# Patient Record
Sex: Female | Born: 1985 | Race: Black or African American | Hispanic: No | Marital: Single | Smoking: Never smoker
Health system: Southern US, Community
[De-identification: ages and names within clinical notes are randomized; demographics above are authoritative.]

## PROBLEM LIST (undated history)

## (undated) DIAGNOSIS — Z789 Other specified health status: Secondary | ICD-10-CM

## (undated) HISTORY — PX: NO PAST SURGERIES: SHX2092

## (undated) HISTORY — DX: Other specified health status: Z78.9

---

## 2016-02-16 ENCOUNTER — Encounter: Payer: Self-pay | Admitting: Certified Nurse Midwife

## 2016-03-06 NOTE — L&D Delivery Note (Signed)
Delivery Note  Patient presented for post-dates induction. Augmented with foley, cytotec, pitocin, and arom shortly prior to delivery.  At 6:31 PM a viable female was delivered via Vaginal, Spontaneous Delivery (Presentation: OA, nuchal x1 ).  APGAR: pending ; weight pending  .   Placenta status: intact.  Cord: 3v. with the following complications: none.  Cord pH: not obtained  Anesthesia:  IV opioids Episiotomy: None Lacerations: periurethral, hemostatic Suture Repair: none Est. Blood Loss (mL): 200  Mom to postpartum.  Baby to Couplet care / Skin to Skin.  Silvano Bilisoah B Kelty Szafran 08/15/2016, 6:44 PM

## 2016-03-13 ENCOUNTER — Ambulatory Visit (INDEPENDENT_AMBULATORY_CARE_PROVIDER_SITE_OTHER): Payer: Medicaid Other | Admitting: Obstetrics & Gynecology

## 2016-03-13 ENCOUNTER — Encounter: Payer: Self-pay | Admitting: Obstetrics & Gynecology

## 2016-03-13 VITALS — BP 126/83 | HR 82 | Temp 98.3°F | Ht 63.0 in | Wt 223.0 lb

## 2016-03-13 DIAGNOSIS — Z3491 Encounter for supervision of normal pregnancy, unspecified, first trimester: Secondary | ICD-10-CM

## 2016-03-13 DIAGNOSIS — Z3480 Encounter for supervision of other normal pregnancy, unspecified trimester: Secondary | ICD-10-CM

## 2016-03-13 DIAGNOSIS — Z3482 Encounter for supervision of other normal pregnancy, second trimester: Secondary | ICD-10-CM

## 2016-03-13 DIAGNOSIS — O99210 Obesity complicating pregnancy, unspecified trimester: Secondary | ICD-10-CM | POA: Insufficient documentation

## 2016-03-13 DIAGNOSIS — Z3400 Encounter for supervision of normal first pregnancy, unspecified trimester: Secondary | ICD-10-CM

## 2016-03-13 DIAGNOSIS — O99212 Obesity complicating pregnancy, second trimester: Secondary | ICD-10-CM

## 2016-03-13 DIAGNOSIS — Z3493 Encounter for supervision of normal pregnancy, unspecified, third trimester: Secondary | ICD-10-CM | POA: Insufficient documentation

## 2016-03-13 DIAGNOSIS — E669 Obesity, unspecified: Secondary | ICD-10-CM

## 2016-03-13 NOTE — Progress Notes (Signed)
..   Clinic [facility] Prenatal Labs  Dating LMP 11/01/15 Blood type: B/Positive/-- (01/08 1303)   Genetic Screen 1 Screen:    AFP:     Quad:     NIPS: Antibody:Negative (01/08 1303)  Anatomic US  Rubella: 5.79 (01/08 1303)  GTT Early:               Third trimester:  RPR: Non Reactive (01/08 1303)   Flu vaccine 03-13-16 HBsAg: Negative (01/08 1303)   TDaP vaccine                                               Rhogam: HIV: Non Reactive (01/08 1217)   Baby Food      bottlefeeding                                         GBS: (For PCN allergy, check sensitivities)  Contraception IUD mirena Pap:  Circumcision Yes, if female   Pediatrician Info given   Support Person FOB

## 2016-03-13 NOTE — Progress Notes (Signed)
  Subjective:    Erika Matthews is a G1P0 3382w6d AA P1 being seen today for her first obstetrical visit.  Her obstetrical history is significant for obesity. Patient does not intend to breast feed. Pregnancy history fully reviewed.  Patient reports no complaints.  Vitals:   03/13/16 1129 03/13/16 1131  BP: 126/83   Pulse: 82   Temp: 98.3 F (36.8 C)   Height:  5\' 3"  (1.6 m)    HISTORY: OB History  Gravida Para Term Preterm AB Living  1            SAB TAB Ectopic Multiple Live Births               # Outcome Date GA Lbr Len/2nd Weight Sex Delivery Anes PTL Lv  1 Current              History reviewed. No pertinent past medical history. History reviewed. No pertinent surgical history. History reviewed. No pertinent family history.   Exam    Uterus:     Pelvic Exam:    Perineum: No Hemorrhoids   Vulva: normal   Vagina:  normal mucosa   pH:    Cervix: anteverted   Adnexa: normal adnexa   Bony Pelvis: android  System: Breast:  normal appearance, no masses or tenderness   Skin: normal coloration and turgor, no rashes    Neurologic: oriented   Extremities: normal strength, tone, and muscle mass   HEENT PERRLA   Mouth/Teeth mucous membranes moist, pharynx normal without lesions   Neck supple   Cardiovascular: regular rate and rhythm   Respiratory:  appears well, vitals normal, no respiratory distress, acyanotic, normal RR, ear and throat exam is normal, neck free of mass or lymphadenopathy, chest clear, no wheezing, crepitations, rhonchi, normal symmetric air entry   Abdomen: soft, non-tender; bowel sounds normal; no masses,  no organomegaly   Urinary: urethral meatus normal      Assessment:    Pregnancy: G1P0 Patient Active Problem List   Diagnosis Date Noted  . Supervision of low-risk first pregnancy, unspecified trimester 03/13/2016  . Obesity in pregnancy 03/13/2016        Plan:     Initial labs drawn. Prenatal vitamins. Problem list reviewed and  updated. Genetic Screening discussed Quad Screen: ordered.  Ultrasound discussed; fetal survey: ordered.  Follow up in 4 weeks. Flu vaccine today HBA1C today She reports [facility] [place] in [facility].  Erika Matthews 03/13/2016

## 2016-03-14 LAB — HIV ANTIBODY (ROUTINE TESTING W REFLEX): HIV Screen 4th Generation wRfx: NONREACTIVE

## 2016-03-15 LAB — CULTURE, OB URINE

## 2016-03-15 LAB — URINE CULTURE, OB REFLEX

## 2016-03-16 ENCOUNTER — Encounter: Payer: Self-pay | Admitting: Obstetrics & Gynecology

## 2016-03-16 ENCOUNTER — Other Ambulatory Visit: Payer: Self-pay | Admitting: *Deleted

## 2016-03-16 DIAGNOSIS — Z3400 Encounter for supervision of normal first pregnancy, unspecified trimester: Secondary | ICD-10-CM

## 2016-03-16 DIAGNOSIS — R7989 Other specified abnormal findings of blood chemistry: Secondary | ICD-10-CM

## 2016-03-16 LAB — HEMOGLOBINOPATHY EVALUATION
HEMOGLOBIN A2 QUANTITATION: 2.2 % (ref 1.8–3.2)
HEMOGLOBIN F QUANTITATION: 0 % (ref 0.0–2.0)
HGB A: 97.8 % (ref 96.4–98.8)
HGB C: 0 %
HGB S: 0 %
HGB VARIANT: 0 %

## 2016-03-16 LAB — AFP, QUAD SCREEN
DIA MOM VALUE: 1.48
DIA VALUE (EIA): 229.38 pg/mL
DSR (BY AGE) 1 IN: 716
DSR (Second Trimester) 1 IN: 1494
Gestational Age: 18.9 WEEKS
MATERNAL AGE AT EDD: 29.8 a
MSAFP Mom: 1.04
MSAFP: 46.3 ng/mL
MSHCG MOM: 0.83
MSHCG: 17471 m[IU]/mL
Osb Risk: 10000
T18 (By Age): 1:2788 {titer}
Test Results:: NEGATIVE
UE3 MOM: 0.69
UE3 VALUE: 1.01 ng/mL
Weight: 200 [lb_av]

## 2016-03-16 LAB — PRENATAL PROFILE I(LABCORP)
ANTIBODY SCREEN: NEGATIVE
BASOS: 0 %
Basophils Absolute: 0 10*3/uL (ref 0.0–0.2)
EOS (ABSOLUTE): 0 10*3/uL (ref 0.0–0.4)
Eos: 0 %
HEMOGLOBIN: 11.7 g/dL (ref 11.1–15.9)
HEP B S AG: NEGATIVE
Hematocrit: 35.9 % (ref 34.0–46.6)
IMMATURE GRANULOCYTES: 0 %
Immature Grans (Abs): 0 10*3/uL (ref 0.0–0.1)
LYMPHS ABS: 1.6 10*3/uL (ref 0.7–3.1)
Lymphs: 20 %
MCH: 29.8 pg (ref 26.6–33.0)
MCHC: 32.6 g/dL (ref 31.5–35.7)
MCV: 91 fL (ref 79–97)
MONOS ABS: 0.5 10*3/uL (ref 0.1–0.9)
Monocytes: 7 %
NEUTROS PCT: 73 %
Neutrophils Absolute: 6 10*3/uL (ref 1.4–7.0)
Platelets: 232 10*3/uL (ref 150–379)
RBC: 3.93 x10E6/uL (ref 3.77–5.28)
RDW: 14.5 % (ref 12.3–15.4)
RH TYPE: POSITIVE
RPR: NONREACTIVE
Rubella Antibodies, IGG: 5.79 index (ref >0.99)
WBC: 8.2 10*3/uL (ref 3.4–10.8)

## 2016-03-16 LAB — VITAMIN D 25 HYDROXY (VIT D DEFICIENCY, FRACTURES): Vit D, 25-Hydroxy: 18.2 ng/mL — ABNORMAL LOW (ref 30.0–100.0)

## 2016-03-16 LAB — HEMOGLOBIN A1C
Est. average glucose Bld gHb Est-mCnc: 103 mg/dL
Hgb A1c MFr Bld: 5.2 % (ref 4.8–5.6)

## 2016-03-16 LAB — VARICELLA ZOSTER ANTIBODY, IGG: Varicella zoster IgG: 649 index (ref >165)

## 2016-03-16 MED ORDER — VITAMIN D (ERGOCALCIFEROL) 1.25 MG (50000 UNIT) PO CAPS: 50000.0000 [IU] | ORAL_CAPSULE | ORAL | 0 refills | Status: DC

## 2016-03-16 NOTE — Progress Notes (Signed)
Vitamin D ordered per Dr Marice Potterove. Pt aware.

## 2016-03-17 LAB — TOXASSURE SELECT 13 (MW), URINE

## 2016-03-20 DIAGNOSIS — Z23 Encounter for immunization: Secondary | ICD-10-CM | POA: Diagnosis not present

## 2016-03-24 ENCOUNTER — Ambulatory Visit (HOSPITAL_COMMUNITY): Payer: Medicaid Other

## 2016-03-30 ENCOUNTER — Other Ambulatory Visit: Payer: Self-pay | Admitting: Obstetrics & Gynecology

## 2016-03-30 ENCOUNTER — Ambulatory Visit (HOSPITAL_COMMUNITY)
Admission: RE | Admit: 2016-03-30 | Discharge: 2016-03-30 | Disposition: A | Discharge status: Home / Self Care | Payer: Medicaid Other | Source: Ambulatory Visit | Attending: Obstetrics & Gynecology | Admitting: Obstetrics & Gynecology

## 2016-03-30 DIAGNOSIS — O99210 Obesity complicating pregnancy, unspecified trimester: Secondary | ICD-10-CM | POA: Diagnosis present

## 2016-03-30 DIAGNOSIS — Z3A21 21 weeks gestation of pregnancy: Secondary | ICD-10-CM | POA: Insufficient documentation

## 2016-03-30 DIAGNOSIS — O99212 Obesity complicating pregnancy, second trimester: Secondary | ICD-10-CM | POA: Insufficient documentation

## 2016-03-30 DIAGNOSIS — Z3689 Encounter for other specified antenatal screening: Secondary | ICD-10-CM | POA: Insufficient documentation

## 2016-03-30 DIAGNOSIS — Z3400 Encounter for supervision of normal first pregnancy, unspecified trimester: Secondary | ICD-10-CM

## 2016-03-30 DIAGNOSIS — Z3480 Encounter for supervision of other normal pregnancy, unspecified trimester: Secondary | ICD-10-CM

## 2016-03-30 DIAGNOSIS — Z369 Encounter for antenatal screening, unspecified: Secondary | ICD-10-CM

## 2016-04-10 ENCOUNTER — Ambulatory Visit (INDEPENDENT_AMBULATORY_CARE_PROVIDER_SITE_OTHER): Payer: Medicaid Other | Admitting: Obstetrics and Gynecology

## 2016-04-10 ENCOUNTER — Ambulatory Visit (HOSPITAL_COMMUNITY): Payer: Medicaid Other

## 2016-04-10 ENCOUNTER — Encounter: Payer: Medicaid Other | Admitting: Obstetrics and Gynecology

## 2016-04-10 VITALS — BP 115/72 | HR 82 | Temp 97.3°F | Wt 233.7 lb

## 2016-04-10 DIAGNOSIS — O99212 Obesity complicating pregnancy, second trimester: Secondary | ICD-10-CM

## 2016-04-10 DIAGNOSIS — Z3400 Encounter for supervision of normal first pregnancy, unspecified trimester: Secondary | ICD-10-CM

## 2016-04-10 DIAGNOSIS — O99210 Obesity complicating pregnancy, unspecified trimester: Secondary | ICD-10-CM

## 2016-04-10 DIAGNOSIS — E669 Obesity, unspecified: Secondary | ICD-10-CM

## 2016-04-10 NOTE — Progress Notes (Signed)
   PRENATAL VISIT NOTE  Subjective:  Erika Matthews is a 31 y.o. G1P0 at 1676w6d being seen today for ongoing prenatal care.  She is currently monitored for the following issues for this low-risk pregnancy and has Supervision of low-risk first pregnancy, unspecified trimester; Obesity in pregnancy; and Low vitamin D level on her problem list.  Patient reports no complaints.  Contractions: Not present. Vag. Bleeding: None.  Movement: Present. Denies leaking of fluid.   The following portions of the patient's history were reviewed and updated as appropriate: allergies, current medications, past family history, past medical history, past social history, past surgical history and problem list. Problem list updated.  Objective:   Vitals:   04/10/16 1303  BP: 115/72  Pulse: 82  Temp: 97.3 F (36.3 C)  Weight: 233 lb 11.2 oz (106 kg)    Fetal Status: Fetal Heart Rate (bpm): 152 Fundal Height: 24 cm Movement: Present     General:  Alert, oriented and cooperative. Patient is in no acute distress.  Skin: Skin is warm and dry. No rash noted.   Cardiovascular: Normal heart rate noted  Respiratory: Normal respiratory effort, no problems with respiration noted  Abdomen: Soft, gravid, appropriate for gestational age. Pain/Pressure: Present     Pelvic:  Cervical exam deferred        Extremities: Normal range of motion.  Edema: None  Mental Status: Normal mood and affect. Normal behavior. Normal judgment and thought content.   Assessment and Plan:  Pregnancy: G1P0 at 1276w6d  1. Supervision of low-risk first pregnancy, unspecified trimester Patient is doing well Reviewed ultrasound results Follow up anatomy ordered Third trimester labs and glucola next visit [facility] - US MFM OB FOLLOW UP; Future  2. Obesity in pregnancy   Preterm labor symptoms and general obstetric precautions including but not limited to vaginal bleeding, contractions,  leaking of fluid and fetal movement were reviewed in detail with the patient. Please refer to After Visit Summary for other counseling recommendations.  Return in about 4 weeks (around 05/08/2016) for ROB and 2 hr glucola.   Catalina AntiguaPeggy Jiya Kissinger, MD

## 2016-05-01 ENCOUNTER — Other Ambulatory Visit: Payer: Medicaid Other

## 2016-05-01 ENCOUNTER — Ambulatory Visit (HOSPITAL_COMMUNITY): Payer: Medicaid Other

## 2016-05-01 ENCOUNTER — Encounter: Payer: Medicaid Other | Admitting: Obstetrics and Gynecology

## 2016-05-08 ENCOUNTER — Encounter: Payer: Medicaid Other | Admitting: Obstetrics and Gynecology

## 2016-05-08 ENCOUNTER — Ambulatory Visit (HOSPITAL_COMMUNITY): Payer: Medicaid Other

## 2016-05-10 ENCOUNTER — Other Ambulatory Visit: Payer: Medicaid Other

## 2016-05-10 ENCOUNTER — Ambulatory Visit (INDEPENDENT_AMBULATORY_CARE_PROVIDER_SITE_OTHER): Payer: Medicaid Other | Admitting: Obstetrics & Gynecology

## 2016-05-10 ENCOUNTER — Ambulatory Visit (HOSPITAL_COMMUNITY)
Admission: RE | Admit: 2016-05-10 | Discharge: 2016-05-10 | Disposition: A | Discharge status: Home / Self Care | Payer: Medicaid Other | Source: Ambulatory Visit | Attending: Obstetrics and Gynecology | Admitting: Obstetrics and Gynecology

## 2016-05-10 DIAGNOSIS — Z362 Encounter for other antenatal screening follow-up: Secondary | ICD-10-CM | POA: Diagnosis not present

## 2016-05-10 DIAGNOSIS — O99212 Obesity complicating pregnancy, second trimester: Secondary | ICD-10-CM | POA: Diagnosis not present

## 2016-05-10 DIAGNOSIS — Z3A27 27 weeks gestation of pregnancy: Secondary | ICD-10-CM | POA: Diagnosis not present

## 2016-05-10 DIAGNOSIS — Z3400 Encounter for supervision of normal first pregnancy, unspecified trimester: Secondary | ICD-10-CM

## 2016-05-10 DIAGNOSIS — Z3402 Encounter for supervision of normal first pregnancy, second trimester: Secondary | ICD-10-CM

## 2016-05-10 MED ORDER — VITAMIN D (ERGOCALCIFEROL) 1.25 MG (50000 UNIT) PO CAPS: 50000.0000 [IU] | ORAL_CAPSULE | ORAL | 0 refills | Status: AC

## 2016-05-10 NOTE — Progress Notes (Signed)
Patient needs new RX for VIT.-D and Prenatals

## 2016-05-10 NOTE — Progress Notes (Signed)
   PRENATAL VISIT NOTE  Subjective:  Erika Matthews is a 31 y.o. G1P0 at 7153w1d being seen today for ongoing prenatal care.  She is currently monitored for the following issues for this low-risk pregnancy and has Supervision of low-risk first pregnancy, unspecified trimester; Obesity in pregnancy; and Low vitamin D level on her problem list.  Patient reports pelvic pressure.  Contractions: Not present. Vag. Bleeding: None.  Movement: Present. Denies leaking of fluid.   The following portions of the patient's history were reviewed and updated as appropriate: allergies, current medications, past family history, past medical history, past social history, past surgical history and problem list. Problem list updated.  Objective:   Vitals:   05/10/16 0918  BP: 117/75  Pulse: 86  Weight: 234 lb (106.1 kg)    Fetal Status: Fetal Heart Rate (bpm): 154   Movement: Present     General:  Alert, oriented and cooperative. Patient is in no acute distress.  Skin: Skin is warm and dry. No rash noted.   Cardiovascular: Normal heart rate noted  Respiratory: Normal respiratory effort, no problems with respiration noted  Abdomen: Soft, gravid, appropriate for gestational age. Pain/Pressure: Present     Pelvic:  Cervical exam deferred        Extremities: Normal range of motion.  Edema: None  Mental Status: Normal mood and affect. Normal behavior. Normal judgment and thought content.   Assessment and Plan:  Pregnancy: G1P0 at 1453w1d  1. Supervision of low-risk first pregnancy, unspecified trimester  - Glucose Tolerance, 2 Hours w/1 Hour - CBC - HIV antibody (with reflex) - RPR - Vitamin D, Ergocalciferol, (DRISDOL) 50000 units CAPS capsule; Take 1 capsule (50,000 Units total) by mouth once a week. For 10 weeks  Dispense: 10 capsule; Refill: 0  Preterm labor symptoms and general obstetric precautions including but not limited to vaginal bleeding, contractions, leaking of fluid and fetal movement were  reviewed in detail with the patient. Please refer to After Visit Summary for other counseling recommendations.  Return in about 4 weeks (around 06/07/2016).   Adam PhenixJames G Byrd Rushlow, MD

## 2016-05-10 NOTE — Patient Instructions (Signed)
Third Trimester of Pregnancy The third trimester is from week 28 through week 40 (months 7 through 9). The third trimester is a time when the unborn baby (fetus) is growing rapidly. At the end of the ninth month, the fetus is about 20 inches in length and weighs 6-10 pounds. Body changes during your third trimester Your body will continue to go through many changes during pregnancy. The changes vary from woman to woman. During the third trimester:  Your weight will continue to increase. You can expect to gain 25-35 pounds (11-16 kg) by the end of the pregnancy.  You may begin to get stretch marks on your hips, abdomen, and breasts.  You may urinate more often because the fetus is moving lower into your pelvis and pressing on your bladder.  You may develop or continue to have heartburn. This is caused by increased hormones that slow down muscles in the digestive tract.  You may develop or continue to have constipation because increased hormones slow digestion and cause the muscles that push waste through your intestines to relax.  You may develop hemorrhoids. These are swollen veins (varicose veins) in the rectum that can itch or be painful.  You may develop swollen, bulging veins (varicose veins) in your legs.  You may have increased body aches in the pelvis, back, or thighs. This is due to weight gain and increased hormones that are relaxing your joints.  You may have changes in your hair. These can include thickening of your hair, rapid growth, and changes in texture. Some women also have hair loss during or after pregnancy, or hair that feels dry or thin. Your hair will most likely return to normal after your baby is born.  Your breasts will continue to grow and they will continue to become tender. A yellow fluid (colostrum) may leak from your breasts. This is the first milk you are producing for your baby.  Your belly button may stick out.  You may notice more swelling in your hands,  face, or ankles.  You may have increased tingling or numbness in your hands, arms, and legs. The skin on your belly may also feel numb.  You may feel short of breath because of your expanding uterus.  You may have more problems sleeping. This can be caused by the size of your belly, increased need to urinate, and an increase in your body's metabolism.  You may notice the fetus "dropping," or moving lower in your abdomen (lightening).  You may have increased vaginal discharge.  You may notice your joints feel loose and you may have pain around your pelvic bone.  What to expect at prenatal visits You will have prenatal exams every 2 weeks until week 36. Then you will have weekly prenatal exams. During a routine prenatal visit:  You will be weighed to make sure you and the baby are growing normally.  Your blood pressure will be taken.  Your abdomen will be measured to track your baby's growth.  The fetal heartbeat will be listened to.  Any test results from the previous visit will be discussed.  You may have a cervical check near your due date to see if your cervix has softened or thinned (effaced).  You will be tested for Group B streptococcus. This happens between 35 and 37 weeks.  Your health care provider may ask you:  What your birth plan is.  How you are feeling.  If you are feeling the baby move.  If you have had   any abnormal symptoms, such as leaking fluid, bleeding, severe headaches, or abdominal cramping.  If you are using any tobacco products, including cigarettes, chewing tobacco, and electronic cigarettes.  If you have any questions.  Other tests or screenings that may be performed during your third trimester include:  Blood tests that check for low iron levels (anemia).  Fetal testing to check the health, activity level, and growth of the fetus. Testing is done if you have certain medical conditions or if there are problems during the  pregnancy.  Nonstress test (NST). This test checks the health of your baby to make sure there are no signs of problems, such as the baby not getting enough oxygen. During this test, a belt is placed around your belly. The baby is made to move, and its heart rate is monitored during movement.  What is false labor? False labor is a condition in which you feel small, irregular tightenings of the muscles in the womb (contractions) that usually go away with rest, changing position, or drinking water. These are called Braxton Hicks contractions. Contractions may last for hours, days, or even weeks before true labor sets in. If contractions come at regular intervals, become more frequent, increase in intensity, or become painful, you should see your health care provider. What are the signs of labor?  Abdominal cramps.  Regular contractions that start at 10 minutes apart and become stronger and more frequent with time.  Contractions that start on the top of the uterus and spread down to the lower abdomen and back.  Increased pelvic pressure and dull back pain.  A watery or bloody mucus discharge that comes from the vagina.  Leaking of amniotic fluid. This is also known as your "water breaking." It could be a slow trickle or a gush. Let your health care provider know if it has a color or strange odor. If you have any of these signs, call your health care provider right away, even if it is before your due date. Follow these instructions at home: Medicines  Follow your health care provider's instructions regarding medicine use. Specific medicines may be either safe or unsafe to take during pregnancy.  Take a prenatal vitamin that contains at least 600 micrograms (mcg) of folic acid.  If you develop constipation, try taking a stool softener if your health care provider approves. Eating and drinking  Eat a balanced diet that includes fresh fruits and vegetables, whole grains, good sources of protein  such as meat, eggs, or tofu, and low-fat dairy. Your health care provider will help you determine the amount of weight gain that is right for you.  Avoid raw meat and uncooked cheese. These carry germs that can cause birth defects in the baby.  If you have low calcium intake from food, talk to your health care provider about whether you should take a daily calcium supplement.  Eat four or five small meals rather than three large meals a day.  Limit foods that are high in fat and processed sugars, such as fried and sweet foods.  To prevent constipation: ? Drink enough fluid to keep your urine clear or pale yellow. ? Eat foods that are high in fiber, such as fresh fruits and vegetables, whole grains, and beans. Activity  Exercise only as directed by your health care provider. Most women can continue their usual exercise routine during pregnancy. Try to exercise for 30 minutes at least 5 days a week. Stop exercising if you experience uterine contractions.  Avoid heavy   lifting.  Do not exercise in extreme heat or humidity, or at high altitudes.  Wear low-heel, comfortable shoes.  Practice good posture.  You may continue to have sex unless your health care provider tells you otherwise. Relieving pain and discomfort  Take frequent breaks and rest with your legs elevated if you have leg cramps or low back pain.  Take warm sitz baths to soothe any pain or discomfort caused by hemorrhoids. Use hemorrhoid cream if your health care provider approves.  Wear a good support bra to prevent discomfort from breast tenderness.  If you develop varicose veins: ? Wear support pantyhose or compression stockings as told by your healthcare provider. ? Elevate your feet for 15 minutes, 3-4 times a day. Prenatal care  Write down your questions. Take them to your prenatal visits.  Keep all your prenatal visits as told by your health care provider. This is important. Safety  Wear your seat belt at  all times when driving.  Make a list of emergency phone numbers, including numbers for family, friends, the hospital, and police and fire departments. General instructions  Avoid cat litter boxes and soil used by cats. These carry germs that can cause birth defects in the baby. If you have a cat, ask someone to clean the litter box for you.  Do not travel far distances unless it is absolutely necessary and only with the approval of your health care provider.  Do not use hot tubs, steam rooms, or saunas.  Do not drink alcohol.  Do not use any products that contain nicotine or tobacco, such as cigarettes and e-cigarettes. If you need help quitting, ask your health care provider.  Do not use any medicinal herbs or unprescribed drugs. These chemicals affect the formation and growth of the baby.  Do not douche or use tampons or scented sanitary pads.  Do not cross your legs for long periods of time.  To prepare for the arrival of your baby: ? Take prenatal classes to understand, practice, and ask questions about labor and delivery. ? Make a trial run to the hospital. ? Visit the hospital and tour the maternity area. ? Arrange for maternity or paternity leave through employers. ? Arrange for family and friends to take care of pets while you are in the hospital. ? Purchase a rear-facing car seat and make sure you know how to install it in your car. ? Pack your hospital bag. ? Prepare the baby's nursery. Make sure to remove all pillows and stuffed animals from the baby's crib to prevent suffocation.  Visit your dentist if you have not gone during your pregnancy. Use a soft toothbrush to brush your teeth and be gentle when you floss. Contact a health care provider if:  You are unsure if you are in labor or if your water has broken.  You become dizzy.  You have mild pelvic cramps, pelvic pressure, or nagging pain in your abdominal area.  You have lower back pain.  You have persistent  nausea, vomiting, or diarrhea.  You have an unusual or bad smelling vaginal discharge.  You have pain when you urinate. Get help right away if:  Your water breaks before 37 weeks.  You have regular contractions less than 5 minutes apart before 37 weeks.  You have a fever.  You are leaking fluid from your vagina.  You have spotting or bleeding from your vagina.  You have severe abdominal pain or cramping.  You have rapid weight loss or weight gain.    You have shortness of breath with chest pain.  You notice sudden or extreme swelling of your face, hands, ankles, feet, or legs.  Your baby makes fewer than 10 movements in 2 hours.  You have severe headaches that do not go away when you take medicine.  You have vision changes. Summary  The third trimester is from week 28 through week 40, months 7 through 9. The third trimester is a time when the unborn baby (fetus) is growing rapidly.  During the third trimester, your discomfort may increase as you and your baby continue to gain weight. You may have abdominal, leg, and back pain, sleeping problems, and an increased need to urinate.  During the third trimester your breasts will keep growing and they will continue to become tender. A yellow fluid (colostrum) may leak from your breasts. This is the first milk you are producing for your baby.  False labor is a condition in which you feel small, irregular tightenings of the muscles in the womb (contractions) that eventually go away. These are called Braxton Hicks contractions. Contractions may last for hours, days, or even weeks before true labor sets in.  Signs of labor can include: abdominal cramps; regular contractions that start at 10 minutes apart and become stronger and more frequent with time; watery or bloody mucus discharge that comes from the vagina; increased pelvic pressure and dull back pain; and leaking of amniotic fluid. This information is not intended to replace advice  given to you by your health care provider. Make sure you discuss any questions you have with your health care provider. Document Released: 02/14/2001 Document Revised: 07/29/2015 Document Reviewed: 04/23/2012 Elsevier Interactive Patient Education  2017 [employer]  

## 2016-05-11 LAB — CBC
HEMATOCRIT: 33.4 % — AB (ref 34.0–46.6)
HEMOGLOBIN: 11.3 g/dL (ref 11.1–15.9)
MCH: 29.8 pg (ref 26.6–33.0)
MCHC: 33.8 g/dL (ref 31.5–35.7)
MCV: 88 fL (ref 79–97)
Platelets: 197 10*3/uL (ref 150–379)
RBC: 3.79 x10E6/uL (ref 3.77–5.28)
RDW: 14.1 % (ref 12.3–15.4)
WBC: 6.9 10*3/uL (ref 3.4–10.8)

## 2016-05-11 LAB — GLUCOSE TOLERANCE, 2 HOURS W/ 1HR
GLUCOSE, FASTING: 85 mg/dL (ref 65–91)
Glucose, 1 hour: 92 mg/dL (ref 65–179)
Glucose, 2 hour: 99 mg/dL (ref 65–152)

## 2016-05-11 LAB — RPR: RPR Ser Ql: NONREACTIVE

## 2016-05-11 LAB — HIV ANTIBODY (ROUTINE TESTING W REFLEX): HIV Screen 4th Generation wRfx: NONREACTIVE

## 2016-06-07 ENCOUNTER — Encounter: Payer: Self-pay | Admitting: Obstetrics & Gynecology

## 2016-06-07 ENCOUNTER — Ambulatory Visit (INDEPENDENT_AMBULATORY_CARE_PROVIDER_SITE_OTHER): Payer: Medicaid Other | Admitting: Obstetrics & Gynecology

## 2016-06-07 DIAGNOSIS — Z3483 Encounter for supervision of other normal pregnancy, third trimester: Secondary | ICD-10-CM

## 2016-06-07 DIAGNOSIS — Z3493 Encounter for supervision of normal pregnancy, unspecified, third trimester: Secondary | ICD-10-CM

## 2016-06-07 NOTE — Progress Notes (Signed)
[place] 3/7 reviewed, 77%ile   PRENATAL VISIT NOTE  Subjective:tired at night  Erika Matthews is a 31 y.o. G2P1001 at [redacted]w[redacted]d being seen today for ongoing prenatal care.  She is currently monitored for the following issues for this low-risk pregnancy and has Supervision of low-risk pregnancy, third trimester; Obesity in pregnancy; and Low vitamin D level on her problem list.  Patient reports fatigue.  Contractions: Not present. Vag. Bleeding: None.  Movement: Present. Denies leaking of fluid.   The following portions of the patient's history were reviewed and updated as appropriate: allergies, current medications, past family history, past medical history, past social history, past surgical history and problem list. Problem list updated.  Objective:   Vitals:   06/07/16 0843  BP: 108/71  Pulse: 95  Weight: 106 kg (233 lb 9.6 oz)    Fetal Status: Fetal Heart Rate (bpm): 130   Movement: Present     General:  Alert, oriented and cooperative. Patient is in no acute distress.  Skin: Skin is warm and dry. No rash noted.   Cardiovascular: Normal heart rate noted  Respiratory: Normal respiratory effort, no problems with respiration noted  Abdomen: Soft, gravid, appropriate for gestational age. Pain/Pressure: Present     Pelvic:  Cervical exam deferred        Extremities: Normal range of motion.  Edema: None  Mental Status: Normal mood and affect. Normal behavior. Normal judgment and thought content.   Assessment and Plan:  Pregnancy: G2P1001 at [redacted]w[redacted]d  1. Supervision of low-risk pregnancy, third trimester Try to stay active and get enough sleep  Preterm labor symptoms and general obstetric precautions including but not limited to vaginal bleeding, contractions, leaking of fluid and fetal movement were reviewed in detail with the patient. Please refer to After Visit Summary for other counseling recommendations.  Return in about 2 weeks (around 06/21/2016).   Adam Phenix, MD

## 2016-06-07 NOTE — Progress Notes (Signed)
Pt presents for ROB without complaints.  

## 2016-06-07 NOTE — Patient Instructions (Signed)
Third Trimester of Pregnancy The third trimester is from week 28 through week 40 (months 7 through 9). The third trimester is a time when the unborn baby (fetus) is growing rapidly. At the end of the ninth month, the fetus is about 20 inches in length and weighs 6-10 pounds. Body changes during your third trimester Your body will continue to go through many changes during pregnancy. The changes vary from woman to woman. During the third trimester:  Your weight will continue to increase. You can expect to gain 25-35 pounds (11-16 kg) by the end of the pregnancy.  You may begin to get stretch marks on your hips, abdomen, and breasts.  You may urinate more often because the fetus is moving lower into your pelvis and pressing on your bladder.  You may develop or continue to have heartburn. This is caused by increased hormones that slow down muscles in the digestive tract.  You may develop or continue to have constipation because increased hormones slow digestion and cause the muscles that push waste through your intestines to relax.  You may develop hemorrhoids. These are swollen veins (varicose veins) in the rectum that can itch or be painful.  You may develop swollen, bulging veins (varicose veins) in your legs.  You may have increased body aches in the pelvis, back, or thighs. This is due to weight gain and increased hormones that are relaxing your joints.  You may have changes in your hair. These can include thickening of your hair, rapid growth, and changes in texture. Some women also have hair loss during or after pregnancy, or hair that feels dry or thin. Your hair will most likely return to normal after your baby is born.  Your breasts will continue to grow and they will continue to become tender. A yellow fluid (colostrum) may leak from your breasts. This is the first milk you are producing for your baby.  Your belly button may stick out.  You may notice more swelling in your hands,  face, or ankles.  You may have increased tingling or numbness in your hands, arms, and legs. The skin on your belly may also feel numb.  You may feel short of breath because of your expanding uterus.  You may have more problems sleeping. This can be caused by the size of your belly, increased need to urinate, and an increase in your body's metabolism.  You may notice the fetus "dropping," or moving lower in your abdomen (lightening).  You may have increased vaginal discharge.  You may notice your joints feel loose and you may have pain around your pelvic bone.  What to expect at prenatal visits You will have prenatal exams every 2 weeks until week 36. Then you will have weekly prenatal exams. During a routine prenatal visit:  You will be weighed to make sure you and the baby are growing normally.  Your blood pressure will be taken.  Your abdomen will be measured to track your baby's growth.  The fetal heartbeat will be listened to.  Any test results from the previous visit will be discussed.  You may have a cervical check near your due date to see if your cervix has softened or thinned (effaced).  You will be tested for Group B streptococcus. This happens between 35 and 37 weeks.  Your health care provider may ask you:  What your birth plan is.  How you are feeling.  If you are feeling the baby move.  If you have had   any abnormal symptoms, such as leaking fluid, bleeding, severe headaches, or abdominal cramping.  If you are using any tobacco products, including cigarettes, chewing tobacco, and electronic cigarettes.  If you have any questions.  Other tests or screenings that may be performed during your third trimester include:  Blood tests that check for low iron levels (anemia).  Fetal testing to check the health, activity level, and growth of the fetus. Testing is done if you have certain medical conditions or if there are problems during the  pregnancy.  Nonstress test (NST). This test checks the health of your baby to make sure there are no signs of problems, such as the baby not getting enough oxygen. During this test, a belt is placed around your belly. The baby is made to move, and its heart rate is monitored during movement.  What is false labor? False labor is a condition in which you feel small, irregular tightenings of the muscles in the womb (contractions) that usually go away with rest, changing position, or drinking water. These are called Braxton Hicks contractions. Contractions may last for hours, days, or even weeks before true labor sets in. If contractions come at regular intervals, become more frequent, increase in intensity, or become painful, you should see your health care provider. What are the signs of labor?  Abdominal cramps.  Regular contractions that start at 10 minutes apart and become stronger and more frequent with time.  Contractions that start on the top of the uterus and spread down to the lower abdomen and back.  Increased pelvic pressure and dull back pain.  A watery or bloody mucus discharge that comes from the vagina.  Leaking of amniotic fluid. This is also known as your "water breaking." It could be a slow trickle or a gush. Let your health care provider know if it has a color or strange odor. If you have any of these signs, call your health care provider right away, even if it is before your due date. Follow these instructions at home: Medicines  Follow your health care provider's instructions regarding medicine use. Specific medicines may be either safe or unsafe to take during pregnancy.  Take a prenatal vitamin that contains at least 600 micrograms (mcg) of folic acid.  If you develop constipation, try taking a stool softener if your health care provider approves. Eating and drinking  Eat a balanced diet that includes fresh fruits and vegetables, whole grains, good sources of protein  such as meat, eggs, or tofu, and low-fat dairy. Your health care provider will help you determine the amount of weight gain that is right for you.  Avoid raw meat and uncooked cheese. These carry germs that can cause birth defects in the baby.  If you have low calcium intake from food, talk to your health care provider about whether you should take a daily calcium supplement.  Eat four or five small meals rather than three large meals a day.  Limit foods that are high in fat and processed sugars, such as fried and sweet foods.  To prevent constipation: ? Drink enough fluid to keep your urine clear or pale yellow. ? Eat foods that are high in fiber, such as fresh fruits and vegetables, whole grains, and beans. Activity  Exercise only as directed by your health care provider. Most women can continue their usual exercise routine during pregnancy. Try to exercise for 30 minutes at least 5 days a week. Stop exercising if you experience uterine contractions.  Avoid heavy   lifting.  Do not exercise in extreme heat or humidity, or at high altitudes.  Wear low-heel, comfortable shoes.  Practice good posture.  You may continue to have sex unless your health care provider tells you otherwise. Relieving pain and discomfort  Take frequent breaks and rest with your legs elevated if you have leg cramps or low back pain.  Take warm sitz baths to soothe any pain or discomfort caused by hemorrhoids. Use hemorrhoid cream if your health care provider approves.  Wear a good support bra to prevent discomfort from breast tenderness.  If you develop varicose veins: ? Wear support pantyhose or compression stockings as told by your healthcare provider. ? Elevate your feet for 15 minutes, 3-4 times a day. Prenatal care  Write down your questions. Take them to your prenatal visits.  Keep all your prenatal visits as told by your health care provider. This is important. Safety  Wear your seat belt at  all times when driving.  Make a list of emergency phone numbers, including numbers for family, friends, the hospital, and police and fire departments. General instructions  Avoid cat litter boxes and soil used by cats. These carry germs that can cause birth defects in the baby. If you have a cat, ask someone to clean the litter box for you.  Do not travel far distances unless it is absolutely necessary and only with the approval of your health care provider.  Do not use hot tubs, steam rooms, or saunas.  Do not drink alcohol.  Do not use any products that contain nicotine or tobacco, such as cigarettes and e-cigarettes. If you need help quitting, ask your health care provider.  Do not use any medicinal herbs or unprescribed drugs. These chemicals affect the formation and growth of the baby.  Do not douche or use tampons or scented sanitary pads.  Do not cross your legs for long periods of time.  To prepare for the arrival of your baby: ? Take prenatal classes to understand, practice, and ask questions about labor and delivery. ? Make a trial run to the hospital. ? Visit the hospital and tour the maternity area. ? Arrange for maternity or paternity leave through employers. ? Arrange for family and friends to take care of pets while you are in the hospital. ? Purchase a rear-facing car seat and make sure you know how to install it in your car. ? Pack your hospital bag. ? Prepare the baby's nursery. Make sure to remove all pillows and stuffed animals from the baby's crib to prevent suffocation.  Visit your dentist if you have not gone during your pregnancy. Use a soft toothbrush to brush your teeth and be gentle when you floss. Contact a health care provider if:  You are unsure if you are in labor or if your water has broken.  You become dizzy.  You have mild pelvic cramps, pelvic pressure, or nagging pain in your abdominal area.  You have lower back pain.  You have persistent  nausea, vomiting, or diarrhea.  You have an unusual or bad smelling vaginal discharge.  You have pain when you urinate. Get help right away if:  Your water breaks before 37 weeks.  You have regular contractions less than 5 minutes apart before 37 weeks.  You have a fever.  You are leaking fluid from your vagina.  You have spotting or bleeding from your vagina.  You have severe abdominal pain or cramping.  You have rapid weight loss or weight gain.    You have shortness of breath with chest pain.  You notice sudden or extreme swelling of your face, hands, ankles, feet, or legs.  Your baby makes fewer than 10 movements in 2 hours.  You have severe headaches that do not go away when you take medicine.  You have vision changes. Summary  The third trimester is from week 28 through week 40, months 7 through 9. The third trimester is a time when the unborn baby (fetus) is growing rapidly.  During the third trimester, your discomfort may increase as you and your baby continue to gain weight. You may have abdominal, leg, and back pain, sleeping problems, and an increased need to urinate.  During the third trimester your breasts will keep growing and they will continue to become tender. A yellow fluid (colostrum) may leak from your breasts. This is the first milk you are producing for your baby.  False labor is a condition in which you feel small, irregular tightenings of the muscles in the womb (contractions) that eventually go away. These are called Braxton Hicks contractions. Contractions may last for hours, days, or even weeks before true labor sets in.  Signs of labor can include: abdominal cramps; regular contractions that start at 10 minutes apart and become stronger and more frequent with time; watery or bloody mucus discharge that comes from the vagina; increased pelvic pressure and dull back pain; and leaking of amniotic fluid. This information is not intended to replace advice  given to you by your health care provider. Make sure you discuss any questions you have with your health care provider. Document Released: 02/14/2001 Document Revised: 07/29/2015 Document Reviewed: 04/23/2012 Elsevier Interactive Patient Education  2017 [employer]  

## 2016-06-21 ENCOUNTER — Encounter: Payer: Self-pay | Admitting: Certified Nurse Midwife

## 2016-06-21 ENCOUNTER — Ambulatory Visit (INDEPENDENT_AMBULATORY_CARE_PROVIDER_SITE_OTHER): Payer: Medicaid Other | Admitting: Certified Nurse Midwife

## 2016-06-21 VITALS — BP 112/77 | HR 97 | Wt 234.4 lb

## 2016-06-21 DIAGNOSIS — E559 Vitamin D deficiency, unspecified: Secondary | ICD-10-CM

## 2016-06-21 DIAGNOSIS — O26843 Uterine size-date discrepancy, third trimester: Secondary | ICD-10-CM

## 2016-06-21 DIAGNOSIS — O99213 Obesity complicating pregnancy, third trimester: Secondary | ICD-10-CM

## 2016-06-21 DIAGNOSIS — Z3493 Encounter for supervision of normal pregnancy, unspecified, third trimester: Secondary | ICD-10-CM

## 2016-06-21 DIAGNOSIS — O99210 Obesity complicating pregnancy, unspecified trimester: Secondary | ICD-10-CM

## 2016-06-21 DIAGNOSIS — R7989 Other specified abnormal findings of blood chemistry: Secondary | ICD-10-CM

## 2016-06-21 NOTE — Progress Notes (Signed)
Patient is in the office, reports good fetal movement, denies pain. 

## 2016-06-21 NOTE — Patient Instructions (Signed)
AREA PEDIATRIC/FAMILY PRACTICE PHYSICIANS  [facility] [address], [address] [place], [place]  [address] Phone - [phone]   Fax - [phone]  [facility] [address] [place], [place] [address] Phone - [phone]   Fax - [phone]  [facility] AM[facility] [address] [place], [place]  [address] Phone - [phone]   Fax - [phone]  [facility] [address] [place], [place]  [address] Phone - [phone]   Fax - [phone]  [facility] [address] [place], [place]  [address] Phone - [phone]   Fax - [phone]  [facility] [address], [address] [place], [place]  [address] Phone - [phone]   Fax - [phone]  [facility] [address] [place], [place]  [address] Phone - [phone]   Fax - [phone]  [facility] [address], [address] [place], [place]  [address] Phone - [phone]   Fax - [phone]  [facility] [address] [place], [place]  [address] Phone - [phone]   Fax - [phone] [facility] [address] [place], [place]  [address] Phone - [phone]   Fax - [phone]  [facility] [address] [place], [place]  [address] Phone - [phone]   Fax - [phone]  [facility] [address] [place], [place]  [address] Phone - [phone]   Fax - [phone]  [facility] [address], [address] [place], [place]  [address] Phone - [phone]   Fax - [phone]  SHILPA GOSRANI [address] [place], [place]  [address] Phone - [phone]  [facility] [address] [place], [place]  [address] Phone - [phone]   Fax - [phone]  [facility] [address] [place], [place]  [address] Phone - [phone]   Fax - [phone]  [facility] [address] [place], [place]  [address] Phone - [phone]   Fax - [phone]  [facility] [address] [place], [place]  [address] Phone - [phone]   Fax - [phone]   [facility] [address] [place], [place]  [address] Phone - [phone]   Fax - [phone]  [facility] [address] [place], [place]  [address] Phone - [phone]   Fax - [phone]  DAVID RUBIN [address] [place], [place]  [address] Phone - [phone]   Fax - [phone]  [facility] [address] [place], [place]  [address] Phone - [phone]   Fax - [phone]  [facility] [address] [place], [place]  [address] Phone - [phone]   Fax - [phone] [facility] [address] [place], [place]  [address] Phone - [phone]   Fax - [phone]  [facility] [address] [place], [place]  [address] Phone - [phone]   Fax - [phone]  [facility] [address] [place], [place]  [address] Phone - [phone]   Fax - [phone]  [facility] Charlene Flemming MD [address] [place]  [address] Phone [phone]  Fax [phone]   

## 2016-06-21 NOTE — Progress Notes (Signed)
   PRENATAL VISIT NOTE  Subjective:  Erika Matthews is a 31 y.o. G2P1001 at [redacted]w[redacted]d being seen today for ongoing prenatal care.  She is currently monitored for the following issues for this low-risk pregnancy and has Supervision of low-risk pregnancy, third trimester; Obesity in pregnancy; and Low vitamin D level on her problem list.  Patient reports backache, no bleeding, no contractions, no cramping and no leaking.  Contractions: Not present. Vag. Bleeding: None.  Movement: Present. Denies leaking of fluid.   The following portions of the patient's history were reviewed and updated as appropriate: allergies, current medications, past family history, past medical history, past social history, past surgical history and problem list. Problem list updated.  Objective:   Vitals:   06/21/16 0910  BP: 112/77  Pulse: 97  Weight: 234 lb 6.4 oz (106.3 kg)    Fetal Status: Fetal Heart Rate (bpm): 150 Fundal Height: 39 cm Movement: Present     General:  Alert, oriented and cooperative. Patient is in no acute distress.  Skin: Skin is warm and dry. No rash noted.   Cardiovascular: Normal heart rate noted  Respiratory: Normal respiratory effort, no problems with respiration noted  Abdomen: Soft, gravid, appropriate for gestational age. Pain/Pressure: Absent     Pelvic:  Cervical exam deferred        Extremities: Normal range of motion.  Edema: None  Mental Status: Normal mood and affect. Normal behavior. Normal judgment and thought content.   Assessment and Plan:  Pregnancy: G2P1001 at [redacted]w[redacted]d  1. Supervision of low-risk pregnancy, third trimester      Doing well  2. Obesity in pregnancy      34 lb weight gain this pregnancy, BMI 35%  3. Low vitamin D level     Taking weekly vitamin D  4. Uterine size date discrepancy pregnancy, third trimester     Size > Dates, EFW in March 77%, normal 2 hour OGTT - [place] MFM OB FOLLOW UP; Future  Preterm labor symptoms and general obstetric precautions  including but not limited to vaginal bleeding, contractions, leaking of fluid and fetal movement were reviewed in detail with the patient. Please refer to After Visit Summary for other counseling recommendations.  Return in about 2 weeks (around 07/05/2016) for ROB, GBS.   Roe Coombs, CNM

## 2016-06-29 ENCOUNTER — Ambulatory Visit (HOSPITAL_COMMUNITY): Payer: Medicaid Other

## 2016-07-06 ENCOUNTER — Other Ambulatory Visit: Payer: Self-pay | Admitting: Certified Nurse Midwife

## 2016-07-06 ENCOUNTER — Encounter: Payer: Self-pay | Admitting: Certified Nurse Midwife

## 2016-07-06 ENCOUNTER — Ambulatory Visit (INDEPENDENT_AMBULATORY_CARE_PROVIDER_SITE_OTHER): Payer: Medicaid Other | Admitting: Certified Nurse Midwife

## 2016-07-06 ENCOUNTER — Ambulatory Visit (HOSPITAL_COMMUNITY)
Admission: RE | Admit: 2016-07-06 | Discharge: 2016-07-06 | Disposition: A | Discharge status: Home / Self Care | Payer: Medicaid Other | Source: Ambulatory Visit | Attending: Certified Nurse Midwife | Admitting: Certified Nurse Midwife

## 2016-07-06 ENCOUNTER — Other Ambulatory Visit (HOSPITAL_COMMUNITY)
Admission: RE | Admit: 2016-07-06 | Discharge: 2016-07-06 | Disposition: A | Discharge status: Home / Self Care | Payer: Medicaid Other | Source: Ambulatory Visit | Attending: Certified Nurse Midwife | Admitting: Certified Nurse Midwife

## 2016-07-06 VITALS — BP 114/72 | HR 109 | Wt 233.0 lb

## 2016-07-06 DIAGNOSIS — E669 Obesity, unspecified: Secondary | ICD-10-CM

## 2016-07-06 DIAGNOSIS — Z3493 Encounter for supervision of normal pregnancy, unspecified, third trimester: Secondary | ICD-10-CM | POA: Insufficient documentation

## 2016-07-06 DIAGNOSIS — Z3A35 35 weeks gestation of pregnancy: Secondary | ICD-10-CM | POA: Diagnosis not present

## 2016-07-06 DIAGNOSIS — O99213 Obesity complicating pregnancy, third trimester: Secondary | ICD-10-CM | POA: Insufficient documentation

## 2016-07-06 DIAGNOSIS — R7989 Other specified abnormal findings of blood chemistry: Secondary | ICD-10-CM

## 2016-07-06 DIAGNOSIS — O26843 Uterine size-date discrepancy, third trimester: Secondary | ICD-10-CM | POA: Insufficient documentation

## 2016-07-06 DIAGNOSIS — O3663X0 Maternal care for excessive fetal growth, third trimester, not applicable or unspecified: Secondary | ICD-10-CM

## 2016-07-06 DIAGNOSIS — O99210 Obesity complicating pregnancy, unspecified trimester: Secondary | ICD-10-CM

## 2016-07-06 NOTE — Progress Notes (Signed)
   PRENATAL VISIT NOTE  Subjective:  Erika Matthews is a 31 y.o. G2P1001 at 2370w2d being seen today for ongoing prenatal care.  She is currently monitored for the following issues for this low-risk pregnancy and has Supervision of low-risk pregnancy, third trimester; Obesity in pregnancy; Low vitamin D level; and Excessive fetal growth affecting management of mother in third trimester, antepartum on her problem list.  Patient reports no complaints.  Contractions: Irregular. Vag. Bleeding: None.  Movement: Present. Denies leaking of fluid.   The following portions of the patient's history were reviewed and updated as appropriate: allergies, current medications, past family history, past medical history, past social history, past surgical history and problem list. Problem list updated.  Objective:   Vitals:   07/06/16 1008  BP: 114/72  Pulse: (!) 109  Weight: 233 lb (105.7 kg)    Fetal Status: Fetal Heart Rate (bpm): 155 Fundal Height: 41 cm Movement: Present     General:  Alert, oriented and cooperative. Patient is in no acute distress.  Skin: Skin is warm and dry. No rash noted.   Cardiovascular: Normal heart rate noted  Respiratory: Normal respiratory effort, no problems with respiration noted  Abdomen: Soft, gravid, appropriate for gestational age. Pain/Pressure: Present     Pelvic:  Cervical exam deferred       Cervical exam Closed,effacement 0% outer os 1cm posterior  Extremities: Normal range of motion.     Mental Status: Normal mood and affect. Normal behavior. Normal judgment and thought content.   Assessment and Plan:  Pregnancy: G2P1001 at 4370w2d  1. Supervision of low-risk pregnancy, third trimester - Strep Gp B NAA - Cervicovaginal ancillary only   2. Obesity in pregnancy 33lb weight gain this pregnancy, BMI 35%  3. Low vitamin D level Continue taking weekly vitamin D  4. Excessive fetal growth affecting management of pregnancy in third trimester, single or  unspecified fetus Size.>dates EFW today 90% - [place] MFM OB FOLLOW UP; Future  Preterm labor symptoms and general obstetric precautions including but not limited to vaginal bleeding, contractions, leaking of fluid and fetal movement were reviewed in detail with the patient. Please refer to After Visit Summary for other counseling recommendations.  Return in about 1 week (around 07/13/2016) for ROB, need to see MD/FP here, LGA noted on US today.   Roe Coombsachelle A Denney, CNM

## 2016-07-07 LAB — CERVICOVAGINAL ANCILLARY ONLY
BACTERIAL VAGINITIS: NEGATIVE
CANDIDA VAGINITIS: NEGATIVE
CHLAMYDIA, DNA PROBE: NEGATIVE
Neisseria Gonorrhea: NEGATIVE
Trichomonas: NEGATIVE

## 2016-07-08 LAB — STREP GP B NAA: Strep Gp B NAA: NEGATIVE

## 2016-07-12 ENCOUNTER — Ambulatory Visit (INDEPENDENT_AMBULATORY_CARE_PROVIDER_SITE_OTHER): Payer: Medicaid Other | Admitting: Obstetrics & Gynecology

## 2016-07-12 VITALS — BP 112/72 | HR 108 | Wt 238.5 lb

## 2016-07-12 DIAGNOSIS — O3663X0 Maternal care for excessive fetal growth, third trimester, not applicable or unspecified: Secondary | ICD-10-CM

## 2016-07-12 NOTE — Progress Notes (Signed)
   PRENATAL VISIT NOTE  Subjective:  Erika Matthews is a 31 y.o. G2P1001 at 4950w1d being seen today for ongoing prenatal care.  She is currently monitored for the following issues for this low-risk pregnancy and has Supervision of low-risk pregnancy, third trimester; Obesity in pregnancy; Low vitamin D level; and Excessive fetal growth affecting management of mother in third trimester, antepartum on her problem list.  Patient reports no complaints.  Contractions: Not present. Vag. Bleeding: None.  Movement: Present. Denies leaking of fluid.   The following portions of the patient's history were reviewed and updated as appropriate: allergies, current medications, past family history, past medical history, past social history, past surgical history and problem list. Problem list updated.  Objective:   Vitals:   07/12/16 1351  BP: 112/72  Pulse: (!) 108  Weight: 238 lb 8 oz (108.2 kg)    Fetal Status: Fetal Heart Rate (bpm): 141   Movement: Present     General:  Alert, oriented and cooperative. Patient is in no acute distress.  Skin: Skin is warm and dry. No rash noted.   Cardiovascular: Normal heart rate noted  Respiratory: Normal respiratory effort, no problems with respiration noted  Abdomen: Soft, gravid, appropriate for gestational age. Pain/Pressure: Present     Pelvic:  Cervical exam deferred        Extremities: Normal range of motion.  Edema: Trace  Mental Status: Normal mood and affect. Normal behavior. Normal judgment and thought content.   Assessment and Plan:  Pregnancy: G2P1001 at 4250w1d  There are no diagnoses linked to this encounter. Preterm labor symptoms and general obstetric precautions including but not limited to vaginal bleeding, contractions, leaking of fluid and fetal movement were reviewed in detail with the patient. Please refer to After Visit Summary for other counseling recommendations.  RTC 1 week  Adam PhenixArnold, James G, MD

## 2016-07-12 NOTE — Patient Instructions (Signed)

## 2016-07-20 ENCOUNTER — Encounter: Payer: Medicaid Other | Admitting: Obstetrics and Gynecology

## 2016-07-21 ENCOUNTER — Encounter: Payer: Self-pay | Admitting: Obstetrics

## 2016-07-21 ENCOUNTER — Other Ambulatory Visit (HOSPITAL_COMMUNITY)
Admission: RE | Admit: 2016-07-21 | Discharge: 2016-07-21 | Disposition: A | Discharge status: Home / Self Care | Payer: Medicaid Other | Source: Ambulatory Visit | Attending: Obstetrics and Gynecology | Admitting: Obstetrics and Gynecology

## 2016-07-21 ENCOUNTER — Ambulatory Visit (INDEPENDENT_AMBULATORY_CARE_PROVIDER_SITE_OTHER): Payer: Medicaid Other | Admitting: Obstetrics

## 2016-07-21 VITALS — BP 129/83 | HR 107 | Wt 237.2 lb

## 2016-07-21 DIAGNOSIS — O26859 Spotting complicating pregnancy, unspecified trimester: Secondary | ICD-10-CM | POA: Diagnosis not present

## 2016-07-21 DIAGNOSIS — Z3483 Encounter for supervision of other normal pregnancy, third trimester: Secondary | ICD-10-CM

## 2016-07-21 DIAGNOSIS — Z3480 Encounter for supervision of other normal pregnancy, unspecified trimester: Secondary | ICD-10-CM

## 2016-07-21 DIAGNOSIS — R319 Hematuria, unspecified: Secondary | ICD-10-CM

## 2016-07-21 LAB — POCT URINALYSIS DIPSTICK
Bilirubin, UA: NEGATIVE
GLUCOSE UA: NEGATIVE
Ketones, UA: NEGATIVE
Leukocytes, UA: NEGATIVE
Nitrite, UA: NEGATIVE
Protein, UA: NEGATIVE
RBC UA: 1
SPEC GRAV UA: 1.01 (ref 1.010–1.025)
UROBILINOGEN UA: NEGATIVE U/dL — AB
pH, UA: 5 (ref 5.0–8.0)

## 2016-07-21 NOTE — Addendum Note (Signed)
Addended by: Elby BeckPAUL, Feliz Herard F on: 07/21/2016 11:12 AM   Modules accepted: Orders

## 2016-07-21 NOTE — Progress Notes (Signed)
Patient did have blood on toilet seat this morning

## 2016-07-21 NOTE — Addendum Note (Signed)
Addended by: Elby BeckPAUL, Ekin Pilar F on: 07/21/2016 10:41 AM   Modules accepted: Orders

## 2016-07-21 NOTE — Progress Notes (Signed)
Subjective:  Erika Matthews is a 31 y.o. G2P1001 at [redacted]w[redacted]d being seen today for ongoing prenatal care.  She is currently monitored for the following issues for this low-risk pregnancy and has Supervision of low-risk pregnancy, third trimester; Obesity in pregnancy; Low vitamin D level; and Excessive fetal growth affecting management of mother in third trimester, antepartum on her problem list.  Patient reports drop of blood on toilet seat this am and backache and occasional contractions.  Contractions: Irregular. Vag. Bleeding: Scant.  Movement: Present. Denies leaking of fluid.   The following portions of the patient's history were reviewed and updated as appropriate: allergies, current medications, past family history, past medical history, past social history, past surgical history and problem list. Problem list updated.  Objective:   Vitals:   07/21/16 1004  BP: 129/83  Pulse: (!) 107  Weight: 237 lb 3.2 oz (107.6 kg)    Fetal Status: Fetal Heart Rate (bpm): 140   Movement: Present     General:  Alert, oriented and cooperative. Patient is in no acute distress.  Skin: Skin is warm and dry. No rash noted.   Cardiovascular: Normal heart rate noted  Respiratory: Normal respiratory effort, no problems with respiration noted  Abdomen: Soft, gravid, appropriate for gestational age. Pain/Pressure: Present     Pelvic:  SSE:  Vagina clean, no lesions or bleeding noted.  Cvx clean and appears long and closed  Extremities: Normal range of motion.  Edema: None  Mental Status: Normal mood and affect. Normal behavior. Normal judgment and thought content.   Urinalysis:      Assessment and Plan:  Pregnancy: G2P1001 at [redacted]w[redacted]d  There are no diagnoses linked to this encounter. Preterm labor symptoms and general obstetric precautions including but not limited to vaginal bleeding, contractions, leaking of fluid and fetal movement were reviewed in detail with the patient. Please refer to After Visit  Summary for other counseling recommendations.  Return in 1 week (on 07/28/2016) for ROB.   Brock BadHarper, Stehanie Ekstrom A, MDPatient ID: Erika CovertAnnajka Handley, female   DOB: 01-01-1986, 31 y.o.   MRN: [id number]

## 2016-07-23 LAB — URINE CULTURE: ORGANISM ID, BACTERIA: NO GROWTH

## 2016-07-23 LAB — PLEASE NOTE

## 2016-07-25 LAB — CERVICOVAGINAL ANCILLARY ONLY
BACTERIAL VAGINITIS: NEGATIVE
CANDIDA VAGINITIS: NEGATIVE
Chlamydia: NEGATIVE
Neisseria Gonorrhea: NEGATIVE
TRICH (WINDOWPATH): NEGATIVE

## 2016-07-27 ENCOUNTER — Ambulatory Visit (HOSPITAL_COMMUNITY)
Admission: RE | Admit: 2016-07-27 | Discharge: 2016-07-27 | Disposition: A | Discharge status: Home / Self Care | Payer: Medicaid Other | Source: Ambulatory Visit | Attending: Certified Nurse Midwife | Admitting: Certified Nurse Midwife

## 2016-07-27 ENCOUNTER — Encounter: Payer: Medicaid Other | Admitting: Obstetrics & Gynecology

## 2016-07-27 DIAGNOSIS — Z3A38 38 weeks gestation of pregnancy: Secondary | ICD-10-CM | POA: Insufficient documentation

## 2016-07-27 DIAGNOSIS — E669 Obesity, unspecified: Secondary | ICD-10-CM | POA: Diagnosis not present

## 2016-07-27 DIAGNOSIS — O99213 Obesity complicating pregnancy, third trimester: Secondary | ICD-10-CM | POA: Insufficient documentation

## 2016-07-27 DIAGNOSIS — Z6839 Body mass index (BMI) 39.0-39.9, adult: Secondary | ICD-10-CM | POA: Diagnosis not present

## 2016-07-27 DIAGNOSIS — O3663X0 Maternal care for excessive fetal growth, third trimester, not applicable or unspecified: Secondary | ICD-10-CM | POA: Insufficient documentation

## 2016-07-28 ENCOUNTER — Other Ambulatory Visit: Payer: Self-pay | Admitting: Certified Nurse Midwife

## 2016-07-28 DIAGNOSIS — Z3493 Encounter for supervision of normal pregnancy, unspecified, third trimester: Secondary | ICD-10-CM

## 2016-08-01 ENCOUNTER — Other Ambulatory Visit: Payer: Self-pay | Admitting: Certified Nurse Midwife

## 2016-08-01 DIAGNOSIS — Z3493 Encounter for supervision of normal pregnancy, unspecified, third trimester: Secondary | ICD-10-CM

## 2016-08-03 ENCOUNTER — Ambulatory Visit (INDEPENDENT_AMBULATORY_CARE_PROVIDER_SITE_OTHER): Payer: Medicaid Other | Admitting: Obstetrics and Gynecology

## 2016-08-03 VITALS — BP 108/75 | HR 82 | Wt 237.0 lb

## 2016-08-03 DIAGNOSIS — Z3493 Encounter for supervision of normal pregnancy, unspecified, third trimester: Secondary | ICD-10-CM

## 2016-08-03 DIAGNOSIS — O3663X0 Maternal care for excessive fetal growth, third trimester, not applicable or unspecified: Secondary | ICD-10-CM

## 2016-08-03 NOTE — Progress Notes (Signed)
ROB pt requests cx check today.

## 2016-08-03 NOTE — Progress Notes (Signed)
   PRENATAL VISIT NOTE  Subjective:  Erika Matthews is a 31 y.o. G2P1001 at 5527w2d being seen today for ongoing prenatal care.  She is currently monitored for the following issues for this low-risk pregnancy and has Supervision of low-risk pregnancy, third trimester; Obesity in pregnancy; Low vitamin D level; and Excessive fetal growth affecting management of mother in third trimester, antepartum on her problem list.  Patient reports no complaints.  Contractions: Irregular. Vag. Bleeding: None.  Movement: Present. Denies leaking of fluid.   The following portions of the patient's history were reviewed and updated as appropriate: allergies, current medications, past family history, past medical history, past social history, past surgical history and problem list. Problem list updated.  Objective:   Vitals:   08/03/16 1007  BP: 108/75  Pulse: 82  Weight: 237 lb (107.5 kg)    Fetal Status: Fetal Heart Rate (bpm): 140 Fundal Height: 39 cm Movement: Present  Presentation: Vertex  General:  Alert, oriented and cooperative. Patient is in no acute distress.  Skin: Skin is warm and dry. No rash noted.   Cardiovascular: Normal heart rate noted  Respiratory: Normal respiratory effort, no problems with respiration noted  Abdomen: Soft, gravid, appropriate for gestational age. Pain/Pressure: Present     Pelvic:  Cervical exam performed Dilation: 1 Effacement (%): Thick Station: Ballotable  Extremities: Normal range of motion.  Edema: Trace  Mental Status: Normal mood and affect. Normal behavior. Normal judgment and thought content.   Assessment and Plan:  Pregnancy: G2P1001 at 3627w2d  1. Supervision of low-risk pregnancy, third trimester Patient is doing well Reviewed ultrasound report with patient and concern for LGA. Patient reports a normal SVD of an 8lb 6oz baby previously without complications Plan for IOL at 41 weeks  2. Excessive fetal growth affecting management of pregnancy in third  trimester, single or unspecified fetus Will repeat AFI as recommended by MFM  Term labor symptoms and general obstetric precautions including but not limited to vaginal bleeding, contractions, leaking of fluid and fetal movement were reviewed in detail with the patient. Please refer to After Visit Summary for other counseling recommendations.  Return in about 1 week (around 08/10/2016) for ROB, NST/AFI.   Catalina AntiguaPeggy Chlora Mcbain, MD

## 2016-08-07 ENCOUNTER — Encounter (HOSPITAL_COMMUNITY): Payer: Self-pay | Admitting: *Deleted

## 2016-08-07 ENCOUNTER — Telehealth (HOSPITAL_COMMUNITY): Payer: Self-pay | Admitting: *Deleted

## 2016-08-07 NOTE — Telephone Encounter (Signed)
Preadmission screen  

## 2016-08-10 ENCOUNTER — Other Ambulatory Visit: Payer: Self-pay | Admitting: Family Medicine

## 2016-08-10 ENCOUNTER — Ambulatory Visit (HOSPITAL_COMMUNITY)
Admission: RE | Admit: 2016-08-10 | Discharge: 2016-08-10 | Disposition: A | Discharge status: Home / Self Care | Payer: Medicaid Other | Source: Ambulatory Visit | Attending: Obstetrics and Gynecology | Admitting: Obstetrics and Gynecology

## 2016-08-10 DIAGNOSIS — O99213 Obesity complicating pregnancy, third trimester: Secondary | ICD-10-CM

## 2016-08-10 DIAGNOSIS — Z3A40 40 weeks gestation of pregnancy: Secondary | ICD-10-CM | POA: Insufficient documentation

## 2016-08-10 DIAGNOSIS — O480 Post-term pregnancy: Secondary | ICD-10-CM

## 2016-08-10 DIAGNOSIS — O4103X0 Oligohydramnios, third trimester, not applicable or unspecified: Secondary | ICD-10-CM

## 2016-08-10 DIAGNOSIS — Z3493 Encounter for supervision of normal pregnancy, unspecified, third trimester: Secondary | ICD-10-CM

## 2016-08-14 ENCOUNTER — Other Ambulatory Visit: Payer: Self-pay | Admitting: Obstetrics and Gynecology

## 2016-08-15 ENCOUNTER — Encounter (HOSPITAL_COMMUNITY): Payer: Self-pay

## 2016-08-15 ENCOUNTER — Inpatient Hospital Stay (HOSPITAL_COMMUNITY)
Admission: RE | Admit: 2016-08-15 | Discharge: 2016-08-16 | DRG: 775 | Disposition: A | Discharge status: Home / Self Care | Payer: Medicaid Other | Source: Ambulatory Visit | Attending: Obstetrics and Gynecology | Admitting: Obstetrics and Gynecology

## 2016-08-15 DIAGNOSIS — R7989 Other specified abnormal findings of blood chemistry: Secondary | ICD-10-CM

## 2016-08-15 DIAGNOSIS — Z3A41 41 weeks gestation of pregnancy: Secondary | ICD-10-CM

## 2016-08-15 DIAGNOSIS — O3663X0 Maternal care for excessive fetal growth, third trimester, not applicable or unspecified: Secondary | ICD-10-CM | POA: Diagnosis present

## 2016-08-15 DIAGNOSIS — O480 Post-term pregnancy: Secondary | ICD-10-CM | POA: Diagnosis present

## 2016-08-15 DIAGNOSIS — Z8759 Personal history of other complications of pregnancy, childbirth and the puerperium: Secondary | ICD-10-CM

## 2016-08-15 DIAGNOSIS — O99210 Obesity complicating pregnancy, unspecified trimester: Secondary | ICD-10-CM

## 2016-08-15 DIAGNOSIS — Z3493 Encounter for supervision of normal pregnancy, unspecified, third trimester: Secondary | ICD-10-CM

## 2016-08-15 LAB — ABO/RH: ABO/RH(D): B POS

## 2016-08-15 LAB — CBC
HCT: 31.9 % — ABNORMAL LOW (ref 36.0–46.0)
HEMOGLOBIN: 10.7 g/dL — AB (ref 12.0–15.0)
MCH: 28.6 pg (ref 26.0–34.0)
MCHC: 33.5 g/dL (ref 30.0–36.0)
MCV: 85.3 fL (ref 78.0–100.0)
Platelets: 198 10*3/uL (ref 150–400)
RBC: 3.74 MIL/uL — AB (ref 3.87–5.11)
RDW: 14 % (ref 11.5–15.5)
WBC: 8.3 10*3/uL (ref 4.0–10.5)

## 2016-08-15 LAB — RPR: RPR Ser Ql: NONREACTIVE

## 2016-08-15 LAB — TYPE AND SCREEN
ABO/RH(D): B POS
Antibody Screen: NEGATIVE

## 2016-08-15 MED ORDER — OXYTOCIN 40 UNITS IN LACTATED RINGERS INFUSION - SIMPLE MED: 1.0000 m[IU]/min | INTRAVENOUS | Status: DC

## 2016-08-15 MED ORDER — ONDANSETRON HCL 4 MG/2ML IJ SOLN: 4.0000 mg | Freq: Four times a day (QID) | INTRAMUSCULAR | Status: DC | PRN

## 2016-08-15 MED ORDER — WITCH HAZEL-GLYCERIN EX PADS: 1.0000 "application " | MEDICATED_PAD | CUTANEOUS | Status: DC | PRN

## 2016-08-15 MED ORDER — OXYTOCIN BOLUS FROM INFUSION
500.0000 mL | Freq: Once | INTRAVENOUS | Status: AC
  Administered 2016-08-15: 500 mL via INTRAVENOUS

## 2016-08-15 MED ORDER — LIDOCAINE HCL (PF) 1 % IJ SOLN
30.0000 mL | INTRAMUSCULAR | Status: DC | PRN
  Filled 2016-08-15: qty 30

## 2016-08-15 MED ORDER — BUTORPHANOL TARTRATE 1 MG/ML IJ SOLN
INTRAMUSCULAR | Status: AC
  Filled 2016-08-15: qty 2

## 2016-08-15 MED ORDER — ACETAMINOPHEN 325 MG PO TABS: 650.0000 mg | ORAL_TABLET | ORAL | Status: DC | PRN

## 2016-08-15 MED ORDER — TERBUTALINE SULFATE 1 MG/ML IJ SOLN
0.2500 mg | Freq: Once | INTRAMUSCULAR | Status: DC | PRN
  Filled 2016-08-15: qty 1

## 2016-08-15 MED ORDER — PRENATAL MULTIVITAMIN CH
1.0000 | ORAL_TABLET | Freq: Every day | ORAL | Status: DC
  Administered 2016-08-16: 1 via ORAL
  Filled 2016-08-15: qty 1

## 2016-08-15 MED ORDER — DIPHENHYDRAMINE HCL 25 MG PO CAPS: 25.0000 mg | ORAL_CAPSULE | Freq: Four times a day (QID) | ORAL | Status: DC | PRN

## 2016-08-15 MED ORDER — TETANUS-DIPHTH-ACELL PERTUSSIS 5-2.5-18.5 LF-MCG/0.5 IM SUSP: 0.5000 mL | Freq: Once | INTRAMUSCULAR | Status: DC

## 2016-08-15 MED ORDER — OXYCODONE-ACETAMINOPHEN 5-325 MG PO TABS: 1.0000 | ORAL_TABLET | ORAL | Status: DC | PRN

## 2016-08-15 MED ORDER — MEASLES, MUMPS & RUBELLA VAC ~~LOC~~ INJ: 0.5000 mL | INJECTION | Freq: Once | SUBCUTANEOUS | Status: DC

## 2016-08-15 MED ORDER — FENTANYL CITRATE (PF) 100 MCG/2ML IJ SOLN: 50.0000 ug | INTRAMUSCULAR | Status: DC | PRN

## 2016-08-15 MED ORDER — ZOLPIDEM TARTRATE 5 MG PO TABS: 5.0000 mg | ORAL_TABLET | Freq: Every evening | ORAL | Status: DC | PRN

## 2016-08-15 MED ORDER — DIBUCAINE 1 % RE OINT: 1.0000 "application " | TOPICAL_OINTMENT | RECTAL | Status: DC | PRN

## 2016-08-15 MED ORDER — SOD CITRATE-CITRIC ACID 500-334 MG/5ML PO SOLN: 30.0000 mL | ORAL | Status: DC | PRN

## 2016-08-15 MED ORDER — OXYTOCIN BOLUS FROM INFUSION: 500.0000 mL | Freq: Once | INTRAVENOUS | Status: DC

## 2016-08-15 MED ORDER — ONDANSETRON HCL 4 MG PO TABS: 4.0000 mg | ORAL_TABLET | ORAL | Status: DC | PRN

## 2016-08-15 MED ORDER — SENNOSIDES-DOCUSATE SODIUM 8.6-50 MG PO TABS
2.0000 | ORAL_TABLET | ORAL | Status: DC
  Administered 2016-08-15: 2 via ORAL
  Filled 2016-08-15: qty 2

## 2016-08-15 MED ORDER — ONDANSETRON HCL 4 MG/2ML IJ SOLN: 4.0000 mg | INTRAMUSCULAR | Status: DC | PRN

## 2016-08-15 MED ORDER — SIMETHICONE 80 MG PO CHEW: 80.0000 mg | CHEWABLE_TABLET | ORAL | Status: DC | PRN

## 2016-08-15 MED ORDER — OXYCODONE-ACETAMINOPHEN 5-325 MG PO TABS: 2.0000 | ORAL_TABLET | ORAL | Status: DC | PRN

## 2016-08-15 MED ORDER — LACTATED RINGERS IV SOLN
INTRAVENOUS | Status: DC
Start: 2016-08-15 — End: 2016-08-15
  Administered 2016-08-15 (×2): 125 mL/h via INTRAVENOUS

## 2016-08-15 MED ORDER — LACTATED RINGERS IV SOLN: 500.0000 mL | INTRAVENOUS | Status: DC | PRN

## 2016-08-15 MED ORDER — PROMETHAZINE HCL 25 MG/ML IJ SOLN
12.5000 mg | Freq: Once | INTRAMUSCULAR | Status: AC
  Administered 2016-08-15: 12.5 mg via INTRAVENOUS
  Filled 2016-08-15: qty 1

## 2016-08-15 MED ORDER — BENZOCAINE-MENTHOL 20-0.5 % EX AERO: 1.0000 "application " | INHALATION_SPRAY | CUTANEOUS | Status: DC | PRN

## 2016-08-15 MED ORDER — FENTANYL CITRATE (PF) 100 MCG/2ML IJ SOLN
100.0000 ug | INTRAMUSCULAR | Status: DC | PRN
  Administered 2016-08-15: 100 ug via INTRAVENOUS
  Filled 2016-08-15: qty 2

## 2016-08-15 MED ORDER — LACTATED RINGERS IV SOLN: INTRAVENOUS | Status: DC

## 2016-08-15 MED ORDER — COCONUT OIL OIL
1.0000 "application " | TOPICAL_OIL | Status: DC | PRN
  Administered 2016-08-16: 1 via TOPICAL
  Filled 2016-08-15: qty 120

## 2016-08-15 MED ORDER — BUTORPHANOL TARTRATE 1 MG/ML IJ SOLN
2.0000 mg | Freq: Once | INTRAMUSCULAR | Status: AC
  Administered 2016-08-15: 2 mg via INTRAVENOUS

## 2016-08-15 MED ORDER — OXYTOCIN 40 UNITS IN LACTATED RINGERS INFUSION - SIMPLE MED: 2.5000 [IU]/h | INTRAVENOUS | Status: DC

## 2016-08-15 MED ORDER — IBUPROFEN 600 MG PO TABS
600.0000 mg | ORAL_TABLET | Freq: Four times a day (QID) | ORAL | Status: DC
  Administered 2016-08-15 – 2016-08-16 (×4): 600 mg via ORAL
  Filled 2016-08-15 (×5): qty 1

## 2016-08-15 MED ORDER — OXYTOCIN 40 UNITS IN LACTATED RINGERS INFUSION - SIMPLE MED
2.5000 [IU]/h | INTRAVENOUS | Status: DC
  Filled 2016-08-15: qty 1000

## 2016-08-15 MED ORDER — MISOPROSTOL 25 MCG QUARTER TABLET
25.0000 ug | ORAL_TABLET | ORAL | Status: DC | PRN
  Administered 2016-08-15 (×2): 25 ug via VAGINAL
  Filled 2016-08-15 (×3): qty 1

## 2016-08-15 NOTE — H&P (Signed)
LABOR AND DELIVERY ADMISSION HISTORY AND PHYSICAL NOTE  Erika Matthews is a 31 y.o. female G3P1011 with IUP at 4419w0d by L/22 presenting for pdiol.   She reports positive fetal movement. She denies leakage of fluid or vaginal bleeding.  Prenatal History/Complications:  Past Medical History: Past Medical History:  Diagnosis Date  . Medical history non-contributory     Past Surgical History: Past Surgical History:  Procedure Laterality Date  . NO PAST SURGERIES      Obstetrical History: OB History    Gravida Para Term Preterm AB Living   3 1 1   1 1    SAB TAB Ectopic Multiple Live Births   1       1      Social History: Social History   Social History  . Marital status: Single    Spouse name: N/A  . Number of children: N/A  . Years of education: N/A   Social History Main Topics  . Smoking status: Never Smoker  . Smokeless tobacco: Never Used  . Alcohol use No  . Drug use: No  . Sexual activity: Not Asked   Other Topics Concern  . None   Social History Narrative  . None    Family History: History reviewed. No pertinent family history.  Allergies: No Known Allergies  Prescriptions Prior to Admission  Medication Sig Dispense Refill Last Dose  . Prenatal Vit-Fe Fumarate-FA (PRENATAL PO) Take 1 tablet by mouth daily.   Taking  . Vitamin D, Ergocalciferol, (DRISDOL) 50000 units CAPS capsule Take 1 capsule (50,000 Units total) by mouth once a week. For 10 weeks 10 capsule 0 Taking     Review of Systems   All systems reviewed and negative except as stated in HPI  Resp. rate 18, height 5\' 3"  (1.6 m), weight 237 lb (107.5 kg), last menstrual period 11/02/2015. General appearance: alert, cooperative and appears stated age Lungs: clear to auscultation bilaterally Heart: regular rate and rhythm Abdomen: soft, non-tender; bowel sounds normal Extremities: No calf swelling or tenderness Presentation: cephalic Fetal monitoring: 140/mod/+a/-d Uterine activity:  quiet Dilation: 2 Effacement (%): 50 Station: -2 Exam by:: Erika HartmannNicole Matthews, Erika Matthews   Prenatal labs: ABO, Rh: --/--/B POS (06/12 0750) Antibody: NEG (06/12 0750) Rubella: imm RPR: Non Reactive (03/07 1045)  HBsAg: Negative (01/08 1303)  HIV: Non Reactive (03/07 1045)  GBS: Negative (05/03 1109)  1 hr Glucola: wnl Genetic screening:  Quad neg Anatomy US: wnl  Prenatal Transfer Tool  Maternal Diabetes: No Genetic Screening: Normal Maternal Ultrasounds/Referrals: Normal Fetal Ultrasounds or other Referrals:  None Maternal Substance Abuse:  No Significant Maternal Medications:  None Significant Maternal Lab Results: Lab values include: Group B Strep negative  Results for orders placed or performed during the hospital encounter of 08/15/16 (from the past 24 hour(s))  CBC   Collection Time: 08/15/16  7:50 AM  Result Value Ref Range   WBC 8.3 4.0 - 10.5 K/uL   RBC 3.74 (L) 3.87 - 5.11 MIL/uL   Hemoglobin 10.7 (L) 12.0 - 15.0 g/dL   HCT 16.131.9 (L) 09.636.0 - 04.546.0 %   MCV 85.3 78.0 - 100.0 fL   MCH 28.6 26.0 - 34.0 pg   MCHC 33.5 30.0 - 36.0 g/dL   RDW 40.914.0 81.111.5 - 91.415.5 %   Platelets 198 150 - 400 K/uL  Type and screen   Collection Time: 08/15/16  7:50 AM  Result Value Ref Range   ABO/RH(D) B POS    Antibody Screen NEG  Sample Expiration 08/18/2016     Patient Active Problem List   Diagnosis Date Noted  . Personal history of previous postdates pregnancy 08/15/2016  . Post-dates pregnancy 08/15/2016  . Excessive fetal growth affecting management of mother in third trimester, antepartum 07/06/2016  . Low vitamin D level 03/16/2016  . Supervision of low-risk pregnancy, third trimester 03/13/2016  . Obesity in pregnancy 03/13/2016    Assessment: Erika Matthews is a 31 y.o. G3P1011 at [redacted]w[redacted]d here for pdiol  #Labor: foley and cyotec placed for ripening #Pain: Non-pharmacologic for now #FWB:  cat 1 #ID:  gbs ng #MOF: bottle #MOC: iud #Circ:  Outpatient #LGA: hx 8 lb 6 oz  nsvd  Erika Matthews 08/15/2016, 9:30 AM

## 2016-08-15 NOTE — Progress Notes (Signed)
Patient in significant pain, called to room to confirm cervical exam. 140/mod/-a/-3, complete, bulging bag, arom clear, will start pushing as pt in significant pain.

## 2016-08-15 NOTE — Anesthesia Pain Management Evaluation Note (Signed)
  CRNA Pain Management Visit Note  Patient: Erika Matthews, 31 y.o., female  "Hello I am a [facility] of the [facility]. We have an anesthesia team available at all times to provide care throughout the hospital, including epidural management and anesthesia for C-section. I don't know your plan for the delivery whether it a natural birth, water birth, IV sedation, nitrous supplementation, doula or epidural, but we want to meet your pain goals."   1.Was your pain managed to your expectations on prior hospitalizations?   No prior hospitalizations  2.What is your expectation for pain management during this hospitalization?     IV pain meds  3.How can we help you reach that goal? Be available if needed  Record the patient's initial score and the patient's pain goal.   Pain: 0  Pain Goal: 5 [facility] wants you to be able to say your pain was always managed very well.  Madison HickmanGREGORY,Chrles Selley 08/15/2016

## 2016-08-16 NOTE — Lactation Note (Signed)
This note was copied from a baby's chart. Lactation Consultation Note  Patient Name: Merri RayBoyB Kaelan Berges ZOXWR'UToday's Date: 08/16/2016 Reason for consult: Initial assessment  Mom [facility] comfort gels [facility] and she will instruct mom on the use.    Maternal Data Has patient been taught Hand Expression?: Yes  Feeding Feeding Type: Breast Fed Length of feed: 20 min  LATCH Score/Interventions                      Lactation Tools Discussed/Used WIC Program: Yes   Consult Status Consult Status: Complete Follow-up type: Call as needed    Kathrin GreathouseMargaret Ann Flois [place] 08/16/2016, 4:57 PM

## 2016-08-16 NOTE — Lactation Note (Signed)
This note was copied from a baby's chart. Lactation Consultation Note  Patient Name: Erika Matthews Erika Matthews ZOXWR'UToday's Date: 08/16/2016 Reason for consult: Initial assessment Baby at 21 hr of life. Dyad may go home tonight. Mom reports during the day baby bf and sleeps well. She denies breast or nipple pain. At night baby if very fussy and will not sleep. She offered formula last night to try to get him to settle down because she was worried that he we hungry. Discussed baby behavior, feeding frequency, baby belly size, supplementing volume guidelines, pumping, maternal diet, voids, wt loss, breast changes, and nipple care. Mom stated her nipples feel "dry". Given coconut oil. Mom stated she can manually express. Given lactation handouts. Aware of OP services and support group. Parents will call as needed.     Maternal Data Has patient been taught Hand Expression?: Yes  Feeding Feeding Type: Breast Fed Length of feed: 20 min  LATCH Score/Interventions                      Lactation Tools Discussed/Used WIC Program: Yes   Consult Status Consult Status: Complete Follow-up type: Call as needed    Erika Matthews 08/16/2016, 3:58 PM

## 2016-08-16 NOTE — Progress Notes (Signed)
Post Partum Day #1 Subjective: no complaints, up ad lib, voiding and tolerating PO  Objective: Blood pressure 116/62, pulse (!) 102, temperature 98.2 F (36.8 C), temperature source Oral, resp. rate 18, height 5\' 3"  (1.6 m), weight 237 lb (107.5 kg), last menstrual period 11/02/2015, SpO2 97 %.  Physical Exam:  General: alert, cooperative and no distress Lochia: appropriate Uterine Fundus: firm Incision: none DVT Evaluation: No evidence of DVT seen on physical exam. No cords or calf tenderness. No significant calf/ankle edema.   Recent Labs  08/15/16 0750  HGB 10.7*  HCT 31.9*    Assessment/Plan: Plan for discharge tomorrow, Breastfeeding and Contraception IUD planned.    LOS: 1 day   Roe CoombsRachelle A Ryan Ogborn, CNM 08/16/2016, 7:33 AM

## 2016-08-16 NOTE — Discharge Instructions (Signed)
Breastfeeding °Deciding to breastfeed is one of the best choices you can make for you and your baby. A change in hormones during pregnancy causes your breast tissue to grow and increases the number and size of your milk ducts. These hormones also allow proteins, sugars, and fats from your blood supply to make breast milk in your milk-producing glands. Hormones prevent breast milk from being released before your baby is born as well as prompt milk flow after birth. Once breastfeeding has begun, thoughts of your baby, as well as his or her sucking or crying, can stimulate the release of milk from your milk-producing glands. °Benefits of breastfeeding °For Your Baby °· Your first milk (colostrum) helps your baby's digestive system function better. °· There are antibodies in your milk that help your baby fight off infections. °· Your baby has a lower incidence of asthma, allergies, and sudden infant death syndrome. °· The nutrients in breast milk are better for your baby than infant formulas and are designed uniquely for your baby’s needs. °· Breast milk improves your baby's brain development. °· Your baby is less likely to develop other conditions, such as childhood obesity, asthma, or type 2 diabetes mellitus. ° °For You °· Breastfeeding helps to create a very special bond between you and your baby. °· Breastfeeding is convenient. Breast milk is always available at the correct temperature and costs nothing. °· Breastfeeding helps to burn calories and helps you lose the weight gained during pregnancy. °· Breastfeeding makes your uterus contract to its prepregnancy size faster and slows bleeding (lochia) after you give birth. °· Breastfeeding helps to lower your risk of developing type 2 diabetes mellitus, osteoporosis, and breast or ovarian cancer later in life. ° °Signs that your baby is hungry °Early Signs of Hunger °· Increased alertness or activity. °· Stretching. °· Movement of the head from side to  side. °· Movement of the head and opening of the mouth when the corner of the mouth or cheek is stroked (rooting). °· Increased sucking sounds, smacking lips, cooing, sighing, or squeaking. °· Hand-to-mouth movements. °· Increased sucking of fingers or hands. ° °Late Signs of Hunger °· Fussing. °· Intermittent crying. ° °Extreme Signs of Hunger °Signs of extreme hunger will require calming and consoling before your baby will be able to breastfeed successfully. Do not wait for the following signs of extreme hunger to occur before you initiate breastfeeding: °· Restlessness. °· A loud, strong cry. °· Screaming. ° °Breastfeeding basics °Breastfeeding Initiation °· Find a comfortable place to sit or lie down, with your neck and back well supported. °· Place a pillow or rolled up blanket under your baby to bring him or her to the level of your breast (if you are seated). Nursing pillows are specially designed to help support your arms and your baby while you breastfeed. °· Make sure that your baby's abdomen is facing your abdomen. °· Gently massage your breast. With your fingertips, massage from your chest wall toward your nipple in a circular motion. This encourages milk flow. You may need to continue this action during the feeding if your milk flows slowly. °· Support your breast with 4 fingers underneath and your thumb above your nipple. Make sure your fingers are well away from your nipple and your baby’s mouth. °· Stroke your baby's lips gently with your finger or nipple. °· When your baby's mouth is open wide enough, quickly bring your baby to your breast, placing your entire nipple and as much of the colored area   around your nipple (areola) as possible into your baby's mouth. °? More areola should be visible above your baby's upper lip than below the lower lip. °? Your baby's tongue should be between his or her lower gum and your breast. °· Ensure that your baby's mouth is correctly positioned around your nipple  (latched). Your baby's lips should create a seal on your breast and be turned out (everted). °· It is common for your baby to suck about 2-3 minutes in order to start the flow of breast milk. ° °Latching °Teaching your baby how to latch on to your breast properly is very important. An improper latch can cause nipple pain and decreased milk supply for you and poor weight gain in your baby. Also, if your baby is not latched onto your nipple properly, he or she may swallow some air during feeding. This can make your baby fussy. Burping your baby when you switch breasts during the feeding can help to get rid of the air. However, teaching your baby to latch on properly is still the best way to prevent fussiness from swallowing air while breastfeeding. °Signs that your baby has successfully latched on to your nipple: °· Silent tugging or silent sucking, without causing you pain. °· Swallowing heard between every 3-4 sucks. °· Muscle movement above and in front of his or her ears while sucking. ° °Signs that your baby has not successfully latched on to nipple: °· Sucking sounds or smacking sounds from your baby while breastfeeding. °· Nipple pain. ° °If you think your baby has not latched on correctly, slip your finger into the corner of your baby’s mouth to break the suction and place it between your baby's gums. Attempt breastfeeding initiation again. °Signs of Successful Breastfeeding °Signs from your baby: °· A gradual decrease in the number of sucks or complete cessation of sucking. °· Falling asleep. °· Relaxation of his or her body. °· Retention of a small amount of milk in his or her mouth. °· Letting go of your breast by himself or herself. ° °Signs from you: °· Breasts that have increased in firmness, weight, and size 1-3 hours after feeding. °· Breasts that are softer immediately after breastfeeding. °· Increased milk volume, as well as a change in milk consistency and color by the fifth day of  breastfeeding. °· Nipples that are not sore, cracked, or bleeding. ° °Signs That Your Baby is Getting Enough Milk °· Wetting at least 1-2 diapers during the first 24 hours after birth. °· Wetting at least 5-6 diapers every 24 hours for the first week after birth. The urine should be clear or pale yellow by 5 days after birth. °· Wetting 6-8 diapers every 24 hours as your baby continues to grow and develop. °· At least 3 stools in a 24-hour period by age 5 days. The stool should be soft and yellow. °· At least 3 stools in a 24-hour period by age 7 days. The stool should be seedy and yellow. °· No loss of weight greater than 10% of birth weight during the first 3 days of age. °· Average weight gain of 4-7 ounces (113-198 g) per week after age 4 days. °· Consistent daily weight gain by age 5 days, without weight loss after the age of 2 weeks. ° °After a feeding, your baby may spit up a small amount. This is common. °Breastfeeding frequency and duration °Frequent feeding will help you make more milk and can prevent sore nipples and breast engorgement. Breastfeed when   you feel the need to reduce the fullness of your breasts or when your baby shows signs of hunger. This is called "breastfeeding on demand." Avoid introducing a pacifier to your baby while you are working to establish breastfeeding (the first 4-6 weeks after your baby is born). After this time you may choose to use a pacifier. Research has shown that pacifier use during the first year of a baby's life decreases the risk of sudden infant death syndrome (SIDS). °Allow your baby to feed on each breast as long as he or she wants. Breastfeed until your baby is finished feeding. When your baby unlatches or falls asleep while feeding from the first breast, offer the second breast. Because newborns are often sleepy in the first few weeks of life, you may need to awaken your baby to get him or her to feed. °Breastfeeding times will vary from baby to baby. However,  the following rules can serve as a guide to help you ensure that your baby is properly fed: °· Newborns (babies 4 weeks of age or younger) may breastfeed every 1-3 hours. °· Newborns should not go longer than 3 hours during the day or 5 hours during the night without breastfeeding. °· You should breastfeed your baby a minimum of 8 times in a 24-hour period until you begin to introduce solid foods to your baby at around 6 months of age. ° °Breast milk pumping °Pumping and storing breast milk allows you to ensure that your baby is exclusively fed your breast milk, even at times when you are unable to breastfeed. This is especially important if you are going back to work while you are still breastfeeding or when you are not able to be present during feedings. Your lactation consultant can give you guidelines on how long it is safe to store breast milk. °A breast pump is a machine that allows you to pump milk from your breast into a sterile bottle. The pumped breast milk can then be stored in a refrigerator or freezer. Some breast pumps are operated by hand, while others use electricity. Ask your lactation consultant which type will work best for you. Breast pumps can be purchased, but some hospitals and breastfeeding support groups lease breast pumps on a monthly basis. A lactation consultant can teach you how to hand express breast milk, if you prefer not to use a pump. °Caring for your breasts while you breastfeed °Nipples can become dry, cracked, and sore while breastfeeding. The following recommendations can help keep your breasts moisturized and healthy: °· Avoid using soap on your nipples. °· Wear a supportive bra. Although not required, special nursing bras and tank tops are designed to allow access to your breasts for breastfeeding without taking off your entire bra or top. Avoid wearing underwire-style bras or extremely tight bras. °· Air dry your nipples for 3-4 minutes after each feeding. °· Use only cotton  bra pads to absorb leaked breast milk. Leaking of breast milk between feedings is normal. °· Use lanolin on your nipples after breastfeeding. Lanolin helps to maintain your skin's normal moisture barrier. If you use pure lanolin, you do not need to wash it off before feeding your baby again. Pure lanolin is not toxic to your baby. You may also hand express a few drops of breast milk and gently massage that milk into your nipples and allow the milk to air dry. ° °In the first few weeks after giving birth, some women experience extremely full breasts (engorgement). Engorgement can make your   breasts feel heavy, warm, and tender to the touch. Engorgement peaks within 3-5 days after you give birth. The following recommendations can help ease engorgement: °· Completely empty your breasts while breastfeeding or pumping. You may want to start by applying warm, moist heat (in the shower or with warm water-soaked hand towels) just before feeding or pumping. This increases circulation and helps the milk flow. If your baby does not completely empty your breasts while breastfeeding, pump any extra milk after he or she is finished. °· Wear a snug bra (nursing or regular) or tank top for 1-2 days to signal your body to slightly decrease milk production. °· Apply ice packs to your breasts, unless this is too uncomfortable for you. °· Make sure that your baby is latched on and positioned properly while breastfeeding. ° °If engorgement persists after 48 hours of following these recommendations, contact your health care provider or a lactation consultant. °Overall health care recommendations while breastfeeding °· Eat healthy foods. Alternate between meals and snacks, eating 3 of each per day. Because what you eat affects your breast milk, some of the foods may make your baby more irritable than usual. Avoid eating these foods if you are sure that they are negatively affecting your baby. °· Drink milk, fruit juice, and water to  satisfy your thirst (about 10 glasses a day). °· Rest often, relax, and continue to take your prenatal vitamins to prevent fatigue, stress, and anemia. °· Continue breast self-awareness checks. °· Avoid chewing and smoking tobacco. Chemicals from cigarettes that pass into breast milk and exposure to secondhand smoke may harm your baby. °· Avoid alcohol and drug use, including marijuana. °Some medicines that may be harmful to your baby can pass through breast milk. It is important to ask your health care provider before taking any medicine, including all over-the-counter and prescription medicine as well as vitamin and herbal supplements. °It is possible to become pregnant while breastfeeding. If birth control is desired, ask your health care provider about options that will be safe for your baby. °Contact a health care provider if: °· You feel like you want to stop breastfeeding or have become frustrated with breastfeeding. °· You have painful breasts or nipples. °· Your nipples are cracked or bleeding. °· Your breasts are red, tender, or warm. °· You have a swollen area on either breast. °· You have a fever or chills. °· You have nausea or vomiting. °· You have drainage other than breast milk from your nipples. °· Your breasts do not become full before feedings by the fifth day after you give birth. °· You feel sad and depressed. °· Your baby is too sleepy to eat well. °· Your baby is having trouble sleeping. °· Your baby is wetting less than 3 diapers in a 24-hour period. °· Your baby has less than 3 stools in a 24-hour period. °· Your baby's skin or the white part of his or her eyes becomes yellow. °· Your baby is not gaining weight by 5 days of age. °Get help right away if: °· Your baby is overly tired (lethargic) and does not want to wake up and feed. °· Your baby develops an unexplained fever. °This information is not intended to replace advice given to you by your health care provider. Make sure you discuss  any questions you have with your health care provider. °Document Released: 02/20/2005 Document Revised: 08/04/2015 Document Reviewed: 08/14/2012 °[employer]vier Interactive Patient Education © 2017 [employer] °Home Care Instructions for Mom °ACTIVITY °·   Gradually return to your regular activities. °· Let yourself rest. Nap while your baby sleeps. °· Avoid lifting anything that is heavier than 10 lb (4.5 kg) until your health care provider says it is okay. °· Avoid activities that take a lot of effort and energy (are strenuous) until approved by your health care provider. Walking at a slow-to-moderate pace is usually safe. °· If you had a cesarean delivery: °? Do not vacuum, climb stairs, or drive a car for 4-6 weeks. °? Have someone help you at home until you feel like you can do your usual activities yourself. °? Do exercises as told by your health care provider, if this applies. ° °VAGINAL BLEEDING °You may continue to bleed for 4-6 weeks after delivery. Over time, the amount of blood usually decreases and the color of the blood usually gets lighter. However, the flow of bright red blood may increase if you have been too active. If you need to use more than one pad in an hour because your pad gets soaked, or if you pass a large clot: °· Lie down. °· Raise your feet. °· Place a cold compress on your lower abdomen. °· Rest. °· Call your health care provider. ° °If you are breastfeeding, your period should return anytime between 8 weeks after delivery and the time that you stop breastfeeding. If you are not breastfeeding, your period should return 6-8 weeks after delivery. °PERINEAL CARE °The perineal area, or perineum, is the part of your body between your thighs. After delivery, this area needs special care. Follow these instructions as told by your health care provider. °· Take warm tub baths for 15-20 minutes. °· Use medicated pads and pain-relieving sprays and creams as told. °· Do not use tampons or douches until  vaginal bleeding has stopped. °· Each time you go to the bathroom: °? Use a peri bottle. °? Change your pad. °? Use towelettes in place of toilet paper until your stitches have healed. °· Do Kegel exercises every day. Kegel exercises help to maintain the muscles that support the vagina, bladder, and bowels. You can do these exercises while you are standing, sitting, or lying down. To do Kegel exercises: °? Tighten the muscles of your abdomen and the muscles that surround your birth canal. °? Hold for a few seconds. °? Relax. °? Repeat until you have done this 5 times in a row. °· To prevent hemorrhoids from developing or getting worse: °? Drink enough fluid to keep your urine clear or pale yellow. °? Avoid straining when having a bowel movement. °? Take over-the-counter medicines and stool softeners as told by your health care provider. ° °BREAST CARE °· Wear a tight-fitting bra. °· Avoid taking over-the-counter pain medicine for breast discomfort. °· Apply ice to the breasts to help with discomfort as needed: °? Put ice in a plastic bag. °? Place a towel between your skin and the bag. °? Leave the ice on for 20 minutes or as told by your health care provider. ° °NUTRITION °· Eat a well-balanced diet. °· Do not try to lose weight quickly by cutting back on calories. °· Take your prenatal vitamins until your postpartum checkup or until your health care provider tells you to stop. ° °POSTPARTUM DEPRESSION °You may find yourself crying for no apparent reason and unable to cope with all of the changes that come with having a newborn. This mood is called postpartum depression. Postpartum depression happens because your hormone levels change after delivery. If you have   postpartum depression, get support from your partner, friends, and family. If the depression does not go away on its own after several weeks, contact your health care provider. °BREAST SELF-EXAM °Do a breast self-exam each month, at the same time of the  month. If you are breastfeeding, check your breasts just after a feeding, when your breasts are less full. If you are breastfeeding and your period has started, check your breasts on day 5, 6, or 7 of your period. °Report any lumps, bumps, or discharge to your health care provider. Know that breasts are normally lumpy if you are breastfeeding. This is temporary, and it is not a health risk. °INTIMACY AND SEXUALITY °Avoid sexual activity for at least 3-4 weeks after delivery or until the brownish-red vaginal flow is completely gone. If you want to avoid pregnancy, use some form of birth control. You can get pregnant after delivery, even if you have not had your period. °SEEK MEDICAL CARE IF: °· You feel unable to cope with the changes that a child brings to your life, and these feelings do not go away after several weeks. °· You notice a lump, a bump, or discharge on your breast. ° °SEEK IMMEDIATE MEDICAL CARE IF: °· Blood soaks your pad in 1 hour or less. °· You have: °? Severe pain or cramping in your lower abdomen. °? A bad-smelling vaginal discharge. °? A fever that is not controlled by medicine. °? A fever, and an area of your breast is red and sore. °? Pain or redness in your calf. °? Sudden, severe chest pain. °? Shortness of breath. °? Painful or bloody urination. °? Problems with your vision. °· You vomit for 12 hours or longer. °· You develop a severe headache. °· You have serious thoughts about hurting yourself, your child, or anyone else. ° °This information is not intended to replace advice given to you by your health care provider. Make sure you discuss any questions you have with your health care provider. °Document Released: 02/18/2000 Document Revised: 07/29/2015 Document Reviewed: 08/24/2014 °Elsevier Interactive Patient Education © 2017 [employer] ° °

## 2016-08-16 NOTE — Discharge Summary (Signed)
DC instruction of pt and infant explained to pt. Pt verbalized understanding. Transported to via car via WC. Assisted with infant into car seat. Pt and infant left in good condition

## 2016-08-16 NOTE — Discharge Summary (Signed)
OB Discharge Summary     Patient Name: Erika Matthews DOB: [birth year] MRN: [id number]  Date of admission: 08/15/2016 Delivering MD: Shonna Chock [place]   Date of discharge: 08/16/2016  Admitting diagnosis: INDUCTION Intrauterine pregnancy: [redacted]w[redacted]d     Secondary diagnosis:  Active Problems:   Personal history of previous postdates pregnancy   Post-dates pregnancy  Additional problems:      Discharge diagnosis: Term Pregnancy Delivered                                                                                                Post partum procedures:none  Augmentation: AROM, Pitocin, Cytotec and Foley Balloon  Complications: None  Hospital course:  Induction of Labor With Vaginal Delivery   31 y.o. yo G3P1011 at [redacted]w[redacted]d was admitted to the hospital 08/15/2016 for induction of labor.  Indication for induction: Postdates.  Patient had an uncomplicated labor course as follows: Membrane Rupture Time/Date: 6:03 PM ,08/15/2016   Intrapartum Procedures: Episiotomy: None [1]                                         Lacerations:  None [1]  Patient had delivery of a Viable infant.  Information for the patient's newborn:  Terriyah, Westra [[id number]]  Delivery Method: Vag-Spont   08/15/2016  Details of delivery can be found in separate delivery note.  Patient had a routine postpartum course. Patient is discharged home 08/16/16.  Physical exam  Vitals:   08/15/16 2025 08/15/16 2128 08/16/16 0128 08/16/16 0841  BP: 100/62 (!) 144/82 116/62 118/71  Pulse: (!) 102 95 (!) 102 91  Resp: 20 18 18 18   Temp: 98.1 F (36.7 C) 98.9 F (37.2 C) 98.2 F (36.8 C) 98.4 F (36.9 C)  TempSrc: Oral Oral Oral Oral  SpO2: 100%  97% 98%  Weight:      Height:       General: alert, cooperative and no distress Lochia: appropriate Uterine Fundus: firm Incision: Healing well with no significant drainage DVT Evaluation: No evidence of DVT seen on physical exam. Labs: Lab Results  Component Value  Date   WBC 8.3 08/15/2016   HGB 10.7 (L) 08/15/2016   HCT 31.9 (L) 08/15/2016   MCV 85.3 08/15/2016   PLT 198 08/15/2016   No flowsheet data found.  Discharge instruction: per After Visit Summary and "Baby and Me Booklet".  After visit meds:  Allergies as of 08/16/2016   No Known Allergies     Medication List    TAKE these medications   PRENATAL PO Take 1 tablet by mouth daily.   Vitamin D (Ergocalciferol) 50000 units Caps capsule Commonly known as:  DRISDOL Take 1 capsule (50,000 Units total) by mouth once a week. For 10 weeks       Diet: routine diet  Activity: Advance as tolerated. Pelvic rest for 6 weeks.   Outpatient follow up:6 weeks Follow up Appt:No future appointments. Follow up Visit:No Follow-up on file.  Postpartum contraception: IUD ?  Newborn Data:  Live born female  Birth Weight: 9 lb 7.2 oz (4285 g) APGAR: 6, 8  Baby Feeding: Bottle and Breast Disposition:home with mother   08/16/2016 Marylene LandKathryn Lorraine Jada Kuhnert, CNM

## 2016-10-11 ENCOUNTER — Ambulatory Visit (INDEPENDENT_AMBULATORY_CARE_PROVIDER_SITE_OTHER): Payer: Medicaid Other | Admitting: Obstetrics & Gynecology

## 2016-10-11 VITALS — BP 131/88 | HR 62 | Ht 63.0 in | Wt 221.5 lb

## 2016-10-11 DIAGNOSIS — Z1389 Encounter for screening for other disorder: Secondary | ICD-10-CM

## 2016-10-11 DIAGNOSIS — Z30430 Encounter for insertion of intrauterine contraceptive device: Secondary | ICD-10-CM | POA: Diagnosis not present

## 2016-10-11 DIAGNOSIS — Z309 Encounter for contraceptive management, unspecified: Secondary | ICD-10-CM

## 2016-10-12 ENCOUNTER — Encounter: Payer: Self-pay | Admitting: Obstetrics & Gynecology

## 2016-10-12 DIAGNOSIS — Z30430 Encounter for insertion of intrauterine contraceptive device: Secondary | ICD-10-CM

## 2016-10-12 MED ORDER — LEVONORGESTREL 19.5 MG IU IUD
1.0000 | INTRAUTERINE_SYSTEM | Freq: Once | INTRAUTERINE | Status: AC
  Administered 2016-10-12: 1 via INTRAUTERINE

## 2016-10-12 MED ORDER — LEVONORGESTREL 20 MCG/24HR IU IUD: 1.0000 | INTRAUTERINE_SYSTEM | Freq: Once | INTRAUTERINE | 0 refills | Status: DC

## 2016-10-12 NOTE — Progress Notes (Signed)
Post Partum Exam  Erika Matthews is a 31 y.o. 293P1011 female who presents for a postpartum visit. She is 9 weeks postpartum following a spontaneous vaginal delivery. I have fully reviewed the prenatal and intrapartum course. The delivery was at 41 gestational weeks.  Anesthesia: none. Postpartum course has been good. Baby's course has been good. Baby is feeding by breast. Bleeding no bleeding. Bowel function is normal. Bladder function is normal. Patient is not sexually active. Contraception method is none. Postpartum depression screening:neg  The following portions of the patient's history were reviewed and updated as appropriate: allergies, current medications, past family history, past medical history, past social history, past surgical history and problem list.  Review of Systems Pertinent items are noted in HPI.    Objective:  Blood pressure 131/88, pulse 62, height 5\' 3"  (1.6 m), weight 221 lb 8 oz (100.5 kg), last menstrual period 09/27/2016, currently breastfeeding.  General:  alert, cooperative and no distress           Abdomen: soft, non-tender; bowel sounds normal; no masses,  no organomegaly   Vulva:  normal  Vagina: normal vagina  Cervix:  no lesions  Corpus: normal  Adnexa:  not evaluated  Rectal Exam: Not performed.        Assessment:    normal postpartum exam. Pap smear not done at today's visit.   Plan:   1. Contraception: IUD 2. Mirena placed today 3. Follow up in: 4 weeks or as needed.   Adam PhenixArnold, Erika Wellnitz G, MD 10/12/2016    GYNECOLOGY OFFICE PROCEDURE NOTE  Erika Matthews is a 31 y.o. G3P1011 here for Mirena IUD insertion. No GYN concerns.  Last pap smear was normal.  IUD Insertion Procedure Note Patient identified, informed consent performed, consent signed.   Discussed risks of irregular bleeding, cramping, infection, malpositioning or misplacement of the IUD outside the uterus which may require further procedure such as laparoscopy. Time out was performed.   Urine pregnancy test negative.  Speculum placed in the vagina.  Cervix visualized.  Cleaned with Betadine x 2.  Grasped anteriorly with a single tooth tenaculum.  Uterus sounded to 8 cm.  Mirena IUD placed per manufacturer's recommendations.  Strings trimmed to 3 cm. Tenaculum was removed, good hemostasis noted.  Patient tolerated procedure well.   Patient was given post-procedure instructions.  She was advised to have backup contraception for one week.  Patient was also asked to check IUD strings periodically and follow up in 4 weeks for IUD check.

## 2016-10-12 NOTE — Patient Instructions (Signed)

## 2016-10-24 ENCOUNTER — Encounter: Payer: Self-pay | Admitting: Obstetrics & Gynecology

## 2017-07-12 ENCOUNTER — Emergency Department (HOSPITAL_COMMUNITY): Admission: EM | Admit: 2017-07-12 | Discharge: 2017-07-13 | Discharge status: Against Medical Advice | Payer: Self-pay

## 2017-07-12 NOTE — ED Notes (Signed)
No answer xx2 inlobby

## 2017-07-12 NOTE — ED Notes (Signed)
Pt. Called for triage. No answer.

## 2017-07-12 NOTE — ED Notes (Signed)
Called x 3 no answer in lobby 

## 2017-09-10 IMAGING — US US MFM OB FOLLOW-UP
1 series · 14 of 28 positions shown · non-contrast
Comparison: none

[Series 1: us mfm ob follow-up · 37 acquisitions, 14 frames shown]
[im 2/37]
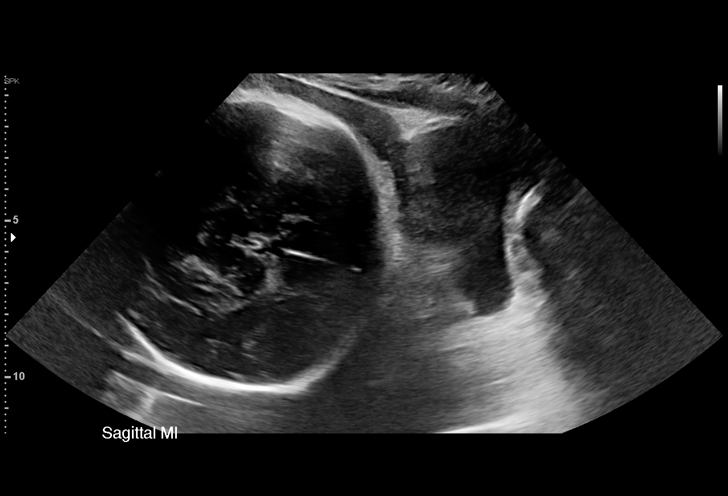
[im 5/37]
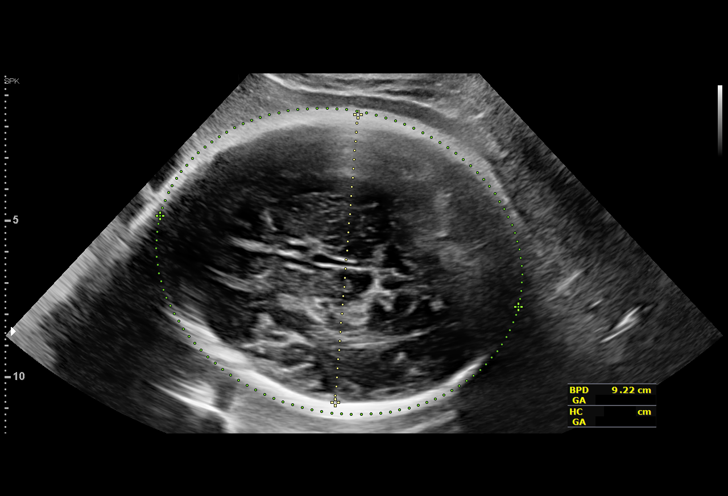
[im 7/37]
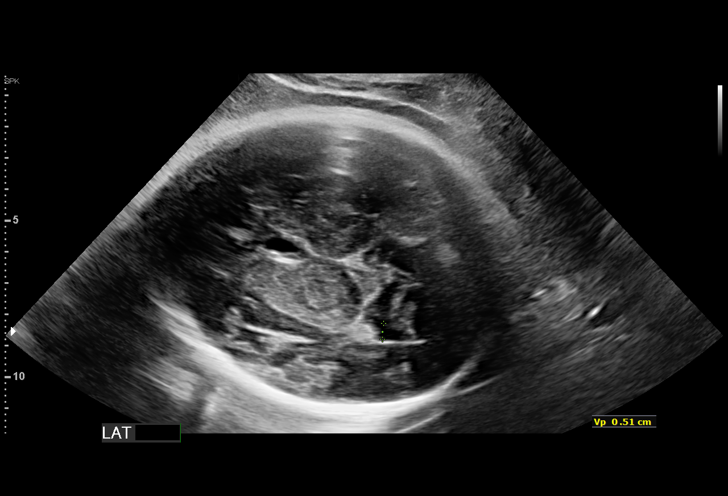
[im 10/37]
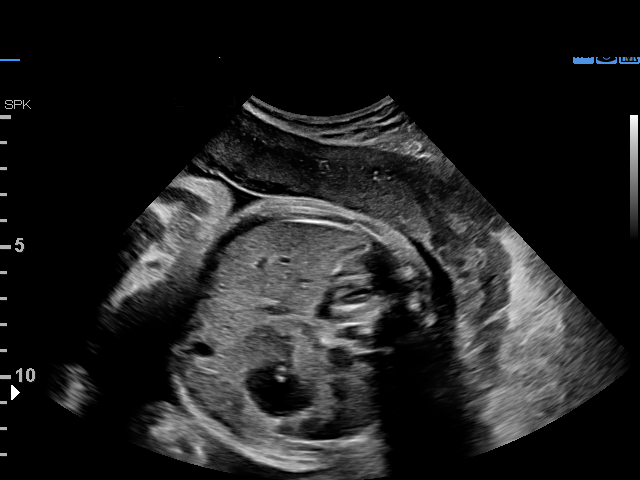
[im 13/37]
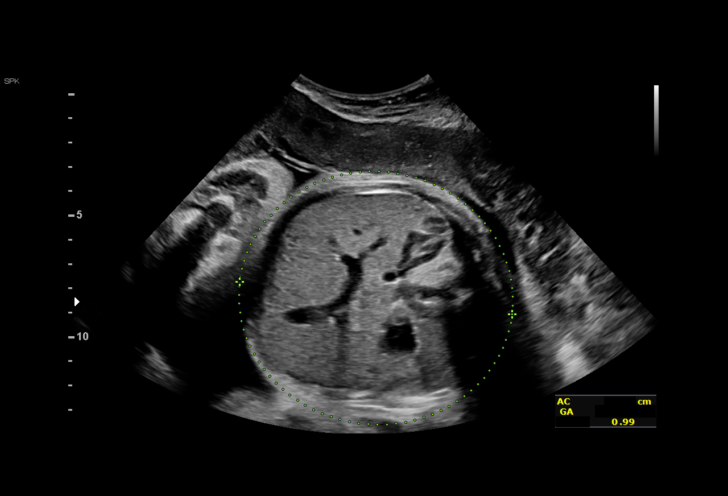
[im 15/37]
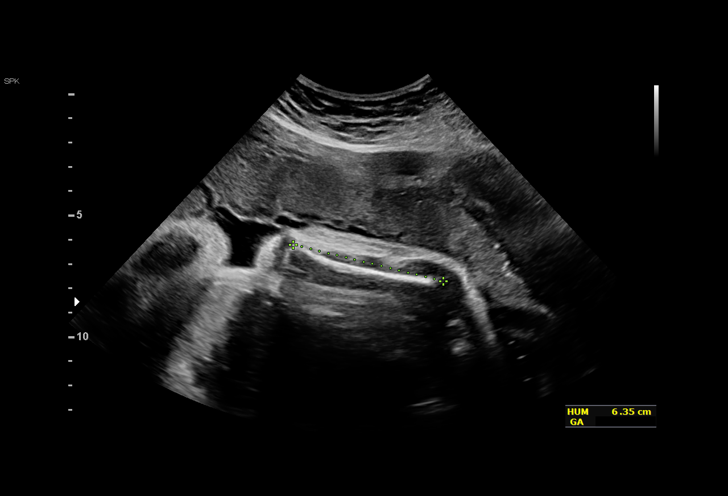
[im 18/37]
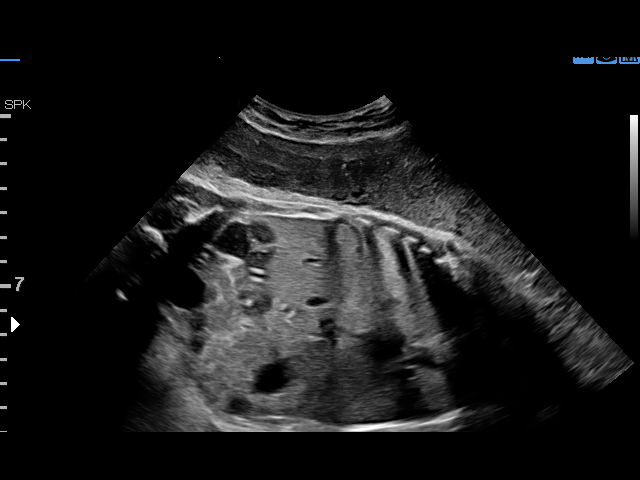
[im 21/37]
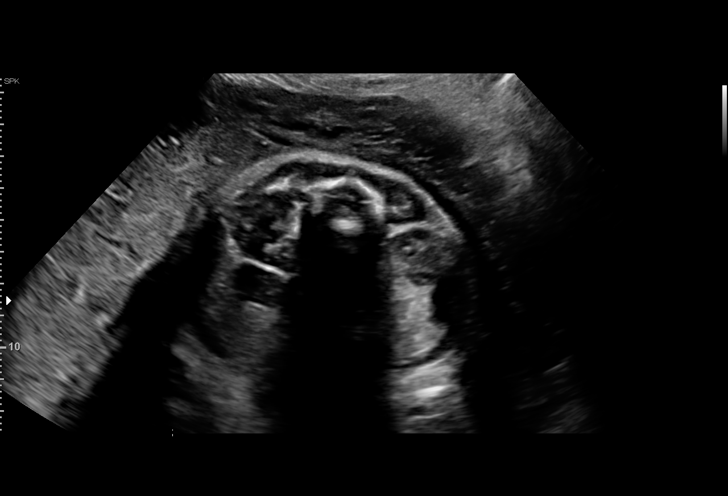
[im 23/37]
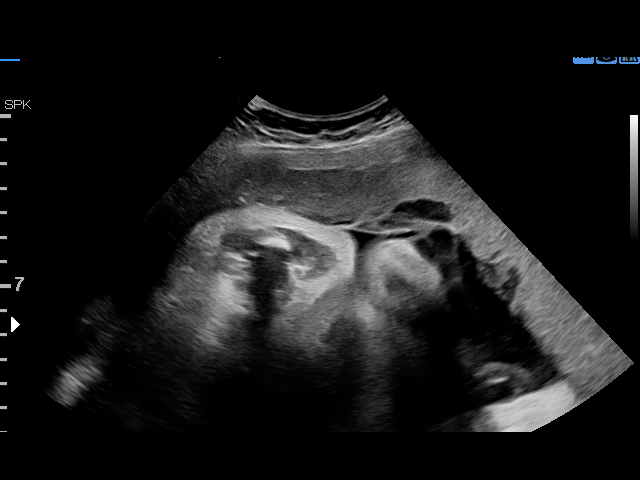
[im 26/37]
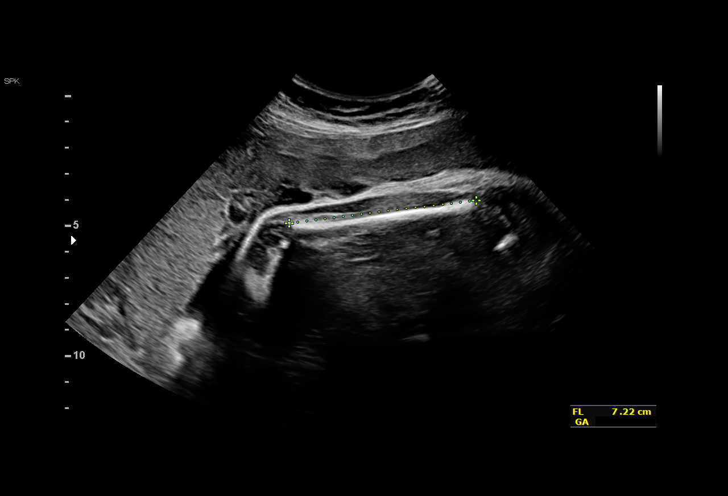
[im 29/37]
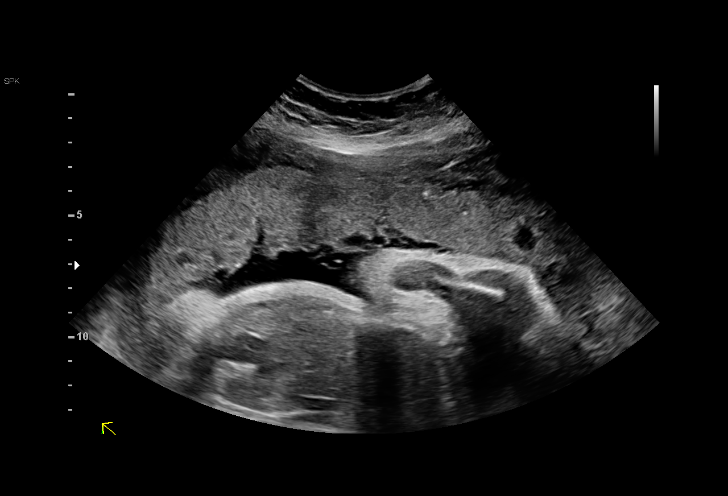
[im 31/37]
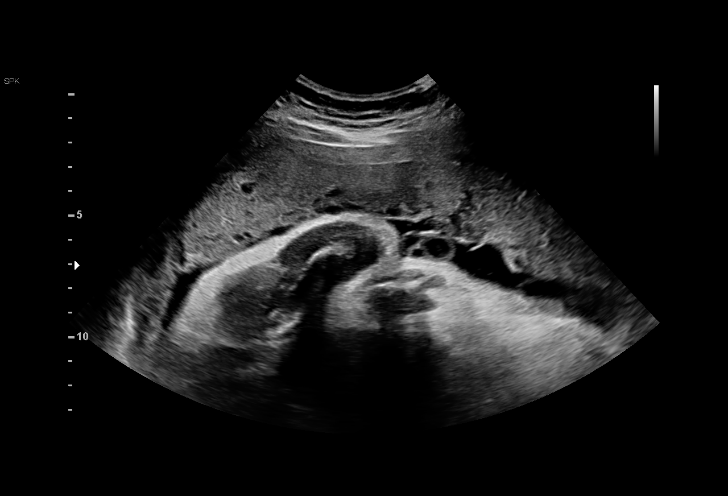
[im 34/37]
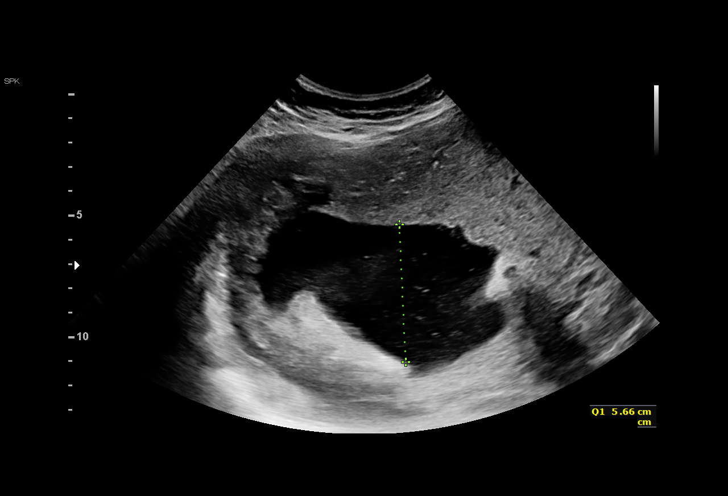
[im 37/37]
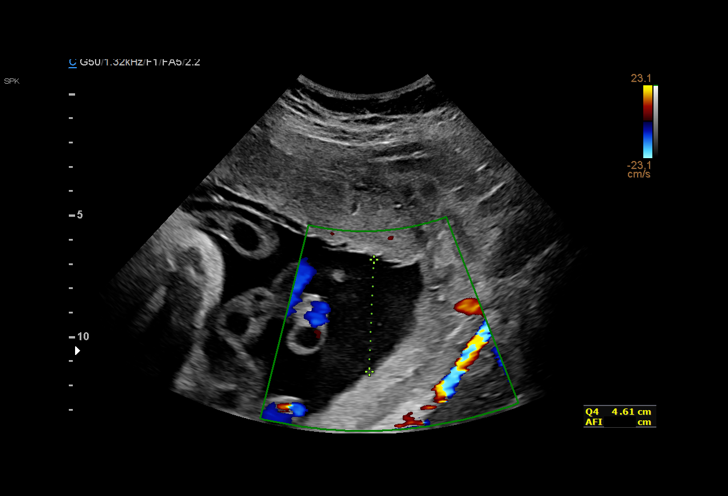

[14 of 28 positions shown; findings below may reference images not displayed]

Indications

35 weeks gestation of pregnancy
Obesity complicating pregnancy, second
trimester
Uterine size-date discrepancy, third trimester
OB History

Blood Type:            Height:  5'3"   Weight (lb):  223      BMI:
Gravidity:    1         Term:   0        Prem:   0        SAB:   0
TOP:          0       Ectopic:  0        Living: 0
Fetal Evaluation

Num Of Fetuses:     1
Fetal Heart         135
Rate(bpm):
Cardiac Activity:   Observed
Presentation:       Cephalic
Placenta:           Anterior, above cervical os

Amniotic Fluid
AFI FV:      Subjectively within normal limits

AFI Sum(cm)     %Tile       Largest Pocket(cm)
14.78           53

RUQ(cm)       RLQ(cm)       LUQ(cm)        LLQ(cm)
5.66
Biometry
BPD:      91.5  mm     G. Age:  37w 1d         93  %    CI:        73.16   %   70 - 86
FL/HC:      21.3   %   20.1 -
HC:       340   mm     G. Age:  39w 1d         94  %    HC/AC:      1.00       0.93 -
AC:      338.5  mm     G. Age:  37w 5d       > 97  %    FL/BPD:     79.2   %   71 - 87
FL:       72.5  mm     G. Age:  37w 1d         85  %    FL/AC:      21.4   %   20 - 24
HUM:        68  mm     G. Age:  39w 4d       > 95  %

Est. FW:    1882  gm      7 lb 4 oz   > 90  %
Gestational Age

LMP:           35w 2d       Date:   11/02/15                 EDD:   08/08/16
U/S Today:     37w 6d                                        EDD:   07/21/16
Best:          35w 2d    Det. By:   LMP  (11/02/15)          EDD:   08/08/16
Anatomy

Cranium:               Appears normal         Aortic Arch:            Previously seen
Cavum:                 Appears normal         Ductal Arch:            Previously seen
Ventricles:            Appears normal         Diaphragm:              Appears normal
Choroid Plexus:        Previously seen        Stomach:                Appears normal, left
sided
Cerebellum:            Previously seen        Abdomen:                Appears normal
Posterior Fossa:       Previously seen        Abdominal Wall:         Previously seen
Nuchal Fold:           Previously seen        Cord Vessels:           Previously seen
Face:                  Orbits and profile     Kidneys:                Appear normal
previously seen
Lips:                  Previously seen        Bladder:                Appears normal
Thoracic:              Appears normal         Spine:                  Previously seen
Heart:                 Appears normal         Upper Extremities:      Previously seen
(4CH, axis, and situs
RVOT:                  Previously seen        Lower Extremities:      Previously seen
LVOT:                  Previously seen

Other:  Fetus appears to be a male. Heels previously seen. Open hands
visualized previously.
Impression

Single IUP at 35w 2d
Size / dates discrepancy
The estimated fetal weight today is > 90th %tile (1882 g).
The AC measures > 97th %tile.
Anterior placenta without previa
Nromal amniotic fluid volume
Recommendations

Consider repeat ultrasound in 3 weeks due to suspected fetal
macrosomia

## 2018-08-04 ENCOUNTER — Emergency Department (HOSPITAL_COMMUNITY): Payer: Medicaid - Out of State

## 2018-08-04 ENCOUNTER — Other Ambulatory Visit: Payer: Self-pay

## 2018-08-04 ENCOUNTER — Encounter (HOSPITAL_COMMUNITY): Payer: Self-pay | Admitting: Emergency Medicine

## 2018-08-04 ENCOUNTER — Emergency Department (HOSPITAL_COMMUNITY)
Admission: EM | Admit: 2018-08-04 | Discharge: 2018-08-04 | Disposition: A | Discharge status: Home / Self Care | Payer: Medicaid - Out of State | Attending: Emergency Medicine | Admitting: Emergency Medicine

## 2018-08-04 DIAGNOSIS — Z3202 Encounter for pregnancy test, result negative: Secondary | ICD-10-CM | POA: Diagnosis not present

## 2018-08-04 DIAGNOSIS — Z79899 Other long term (current) drug therapy: Secondary | ICD-10-CM | POA: Insufficient documentation

## 2018-08-04 DIAGNOSIS — R51 Headache: Secondary | ICD-10-CM | POA: Insufficient documentation

## 2018-08-04 DIAGNOSIS — M791 Myalgia, unspecified site: Secondary | ICD-10-CM | POA: Diagnosis not present

## 2018-08-04 DIAGNOSIS — R509 Fever, unspecified: Secondary | ICD-10-CM | POA: Insufficient documentation

## 2018-08-04 DIAGNOSIS — Z20828 Contact with and (suspected) exposure to other viral communicable diseases: Secondary | ICD-10-CM | POA: Diagnosis not present

## 2018-08-04 LAB — CBC WITH DIFFERENTIAL/PLATELET
Abs Immature Granulocytes: 0.03 10*3/uL (ref 0.00–0.07)
Basophils Absolute: 0 10*3/uL (ref 0.0–0.1)
Basophils Relative: 0 %
Eosinophils Absolute: 0 10*3/uL (ref 0.0–0.5)
Eosinophils Relative: 1 %
HCT: 38.7 % (ref 36.0–46.0)
Hemoglobin: 12.6 g/dL (ref 12.0–15.0)
Immature Granulocytes: 0 %
Lymphocytes Relative: 12 %
Lymphs Abs: 0.8 10*3/uL (ref 0.7–4.0)
MCH: 29 pg (ref 26.0–34.0)
MCHC: 32.6 g/dL (ref 30.0–36.0)
MCV: 89.2 fL (ref 80.0–100.0)
Monocytes Absolute: 0.4 10*3/uL (ref 0.1–1.0)
Monocytes Relative: 6 %
Neutro Abs: 5.4 10*3/uL (ref 1.7–7.7)
Neutrophils Relative %: 81 %
Platelets: 242 10*3/uL (ref 150–400)
RBC: 4.34 MIL/uL (ref 3.87–5.11)
RDW: 12.5 % (ref 11.5–15.5)
WBC: 6.8 10*3/uL (ref 4.0–10.5)
nRBC: 0 % (ref 0.0–0.2)

## 2018-08-04 LAB — URINALYSIS, ROUTINE W REFLEX MICROSCOPIC
Bilirubin Urine: NEGATIVE
Glucose, UA: NEGATIVE mg/dL
Hgb urine dipstick: NEGATIVE
Ketones, ur: NEGATIVE mg/dL
Leukocytes,Ua: NEGATIVE
Nitrite: NEGATIVE
Protein, ur: NEGATIVE mg/dL
Specific Gravity, Urine: 1.014 (ref 1.005–1.030)
pH: 8 (ref 5.0–8.0)

## 2018-08-04 LAB — COMPREHENSIVE METABOLIC PANEL
ALT: 20 U/L (ref 0–44)
AST: 18 U/L (ref 15–41)
Albumin: 3.9 g/dL (ref 3.5–5.0)
Alkaline Phosphatase: 47 U/L (ref 38–126)
Anion gap: 9 (ref 5–15)
BUN: 10 mg/dL (ref 6–20)
CO2: 25 mmol/L (ref 22–32)
Calcium: 9.3 mg/dL (ref 8.9–10.3)
Chloride: 103 mmol/L (ref 98–111)
Creatinine, Ser: 0.73 mg/dL (ref 0.44–1.00)
GFR calc Af Amer: >60 mL/min (ref >60)
GFR calc non Af Amer: >60 mL/min (ref >60)
Glucose, Bld: 106 mg/dL — ABNORMAL HIGH (ref 70–99)
Potassium: 4.1 mmol/L (ref 3.5–5.1)
Sodium: 137 mmol/L (ref 135–145)
Total Bilirubin: 0.6 mg/dL (ref 0.3–1.2)
Total Protein: 6.9 g/dL (ref 6.5–8.1)

## 2018-08-04 LAB — SARS CORONAVIRUS 2 BY RT PCR (HOSPITAL ORDER, PERFORMED IN ~~LOC~~ HOSPITAL LAB): SARS Coronavirus 2: NEGATIVE

## 2018-08-04 LAB — LACTIC ACID, PLASMA: Lactic Acid, Venous: 1 mmol/L (ref 0.5–1.9)

## 2018-08-04 LAB — PREGNANCY, URINE: Preg Test, Ur: NEGATIVE

## 2018-08-04 MED ORDER — ACETAMINOPHEN 325 MG PO TABS
650.0000 mg | ORAL_TABLET | Freq: Once | ORAL | Status: AC
  Administered 2018-08-04: 650 mg via ORAL
  Filled 2018-08-04: qty 2

## 2018-08-04 MED ORDER — SODIUM CHLORIDE 0.9 % IV BOLUS
1000.0000 mL | Freq: Once | INTRAVENOUS | Status: AC
  Administered 2018-08-04: 1000 mL via INTRAVENOUS

## 2018-08-04 NOTE — ED Provider Notes (Signed)
Fever, body aches and chills 3 hours PTA.   I assumed care of patient from previous team, please see their note for full H&P.  Physical Exam  BP (!) 111/99 (BP Location: Right Arm)   Pulse 91   Temp (!) 101.9 F (38.8 C)   Resp 20   Ht 5\' 2"  (1.575 m)   Wt 110.2 kg   BMI 44.45 kg/m   Physical Exam Vitals signs and nursing note reviewed.  Constitutional:      General: She is not in acute distress. HENT:     Head: Normocephalic.  Cardiovascular:     Rate and Rhythm: Normal rate.  Pulmonary:     Effort: Pulmonary effort is normal. No respiratory distress.  Neurological:     Mental Status: She is alert.  Psychiatric:        Mood and Affect: Mood normal.     ED Course/Procedures     Procedures Dg Chest Port 1 View  Result Date: 08/04/2018 CLINICAL DATA:  Sudden onset of fever. EXAM: PORTABLE CHEST 1 VIEW COMPARISON:  None. FINDINGS: The heart size and mediastinal contours are within normal limits. Both lungs are clear. The visualized skeletal structures are unremarkable. IMPRESSION: No active disease. Electronically Signed   By: Gerome Sam III M.D   On: 08/04/2018 14:38   Labs Reviewed  COMPREHENSIVE METABOLIC PANEL - Abnormal; Notable for the following components:      Result Value   Glucose, Bld 106 (*)    All other components within normal limits  SARS CORONAVIRUS 2 (HOSPITAL ORDER, PERFORMED IN [facility])  CBC WITH DIFFERENTIAL/PLATELET  LACTIC ACID, PLASMA  URINALYSIS, ROUTINE W REFLEX MICROSCOPIC  PREGNANCY, URINE    MDM  Pain is to f/u labs, anticipate d/c home  Labs reviewed.  Imaging is reviewed.  She tested negative for coronavirus however she and I discussed the false-negative concern given that she has a fever without other clear cause in the setting of a global pandemic.  I recommended that, even though her test was negative, she quarantine herself at home for a minimum of 7 days.  Return precautions were discussed with patient who  states their understanding.  At the time of discharge patient denied any unaddressed complaints or concerns.  Patient is agreeable for discharge home.  Wynetta Teichman was evaluated in Emergency Department on 08/04/2018 for the symptoms described in the history of present illness. She was evaluated in the context of the global COVID-19 pandemic, which necessitated consideration that the patient might be at risk for infection with the SARS-CoV-2 virus that causes COVID-19. Institutional protocols and algorithms that pertain to the evaluation of patients at risk for COVID-19 are in a state of rapid change based on information released by regulatory bodies including the CDC and federal and state organizations. These policies and algorithms were followed during the patient's care in the ED.        Norman Clay 08/04/18 Claudette Laws, MD 08/08/18 [phone]

## 2018-08-04 NOTE — Discharge Instructions (Addendum)
Please take Ibuprofen (Advil, motrin) and Tylenol (acetaminophen) to relieve your pain.  You may take up to 600 MG (3 pills) of normal strength ibuprofen every 8 hours as needed.  In between doses of ibuprofen you make take tylenol, up to 1,000 mg (two extra strength pills).  Do not take more than 3,000 mg tylenol in a 24 hour period.  Please check all medication labels as many medications such as pain and cold medications may contain tylenol.  Do not drink alcohol while taking these medications.  Do not take other NSAID'S while taking ibuprofen (such as aleve or naproxen).  Please take ibuprofen with food to decrease stomach upset.   As we discussed today your coronavirus test was negative however there is a chance that you may still have coronavirus.  Please isolate yourself at home to limit exposing others.  You must remain isolated for a minimum of 7 days from the onset of symptoms.  After that 7 days you must have 3 days or you do not have a fever and have significant improvement in your symptoms.  We also discussed today that we do not have a specific cause for your fever and it may be too early in the course.  If you develop other symptoms or have additional concerns please seek additional medical care and evaluation.

## 2018-08-04 NOTE — ED Triage Notes (Signed)
Sudden onset of fever, body aches and chills. Started about 3 hours ago. Denies cough, nausea, vomiting

## 2018-08-04 NOTE — ED Provider Notes (Signed)
[facility] Note   CSN: [id number] Arrival date & time: 08/04/18  1331    History   Chief Complaint Chief Complaint  Patient presents with  . Fever    HPI Erika Matthews is a 33 y.o. female who presents to the ED complaining of sudden onset subjective fever, chills, and body aches that began 3 hours ago. Pt reports she was sitting in the carwash when the symptoms suddenly began. She endorses a 2/10 frontal headache as well but otherwise denies any other symptoms at this time. 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 Pt has not taken anything for her symptoms. No visual changes, neck pain, neck stiffness, rash.        Past Medical History:  Diagnosis Date  . Medical history non-contributory     Patient Active Problem List   Diagnosis Date Noted  . Personal history of previous postdates pregnancy 08/15/2016  . Post-dates pregnancy 08/15/2016  . Excessive fetal growth affecting management of mother in third trimester, antepartum 07/06/2016  . Low vitamin D level 03/16/2016    Past Surgical History:  Procedure Laterality Date  . NO PAST SURGERIES       OB History    Gravida  3   Para  1   Term  1   Preterm      AB  1   Living  1     SAB  1   TAB      Ectopic      Multiple      Live Births  1            Home Medications    Prior to Admission medications   Medication Sig Start Date End Date Taking? Authorizing Provider  Prenatal Vit-Fe Fumarate-FA (PRENATAL PO) Take 1 tablet by mouth daily.    [provider]  Vitamin D, Ergocalciferol, (DRISDOL) 50000 units CAPS capsule Take 1 capsule (50,000 Units total) by mouth once a week. For 10 weeks 05/10/16   Adam PhenixArnold, James G, MD    Family History History reviewed. No pertinent family history.  Social History Social History   Tobacco Use   . Smoking status: Never Smoker  . Smokeless tobacco: Never Used  Substance Use Topics  . Alcohol use: No  . Drug use: No     Allergies   Patient has no known allergies.   Review of Systems Review of Systems  Constitutional: Positive for chills and fever.  HENT: Negative for congestion, ear pain, rhinorrhea and sore throat.   Eyes: Negative for visual disturbance.  Respiratory: Negative for cough and shortness of breath.   Cardiovascular: Negative for chest pain.  Gastrointestinal: Negative for abdominal pain, constipation, diarrhea, nausea and vomiting.  Genitourinary: Negative for dysuria, flank pain, frequency, hematuria, pelvic pain, vaginal bleeding and vaginal discharge.  Musculoskeletal: Positive for myalgias. Negative for neck pain and neck stiffness.  Skin: Negative for rash.  Neurological: Positive for headaches. Negative for dizziness, speech difficulty, weakness, light-headedness and numbness.     Physical Exam Updated Vital Signs BP (!) 111/99 (BP Location: Right Arm)   Pulse 91   Temp (!) 101.9 F (38.8 C)   Resp 20   Ht 5\' 2"  (1.575 m)   Wt 110.2 kg   BMI 44.45 kg/m   Physical Exam Vitals signs and nursing note reviewed.  Constitutional:  Appearance: She is not ill-appearing or diaphoretic.  HENT:     Head: Normocephalic and atraumatic.     Right Ear: Tympanic membrane normal.     Left Ear: Tympanic membrane normal.     Nose: Nose normal.     Mouth/Throat:     Mouth: Mucous membranes are moist.     Pharynx: No oropharyngeal exudate or posterior oropharyngeal erythema.  Eyes:     Extraocular Movements: Extraocular movements intact.     Conjunctiva/sclera: Conjunctivae normal.     Pupils: Pupils are equal, round, and reactive to light.  Neck:     Musculoskeletal: Neck supple.  Cardiovascular:     Rate and Rhythm: Normal rate and regular rhythm.     Pulses: Normal pulses.  Pulmonary:     Effort: Pulmonary effort is normal.     Breath  sounds: Normal breath sounds. No wheezing, rhonchi or rales.  Abdominal:     Palpations: Abdomen is soft.     Tenderness: There is no abdominal tenderness. There is no guarding or rebound.  Musculoskeletal:     Right lower leg: No edema.     Left lower leg: No edema.  Skin:    General: Skin is warm and dry.  Neurological:     Mental Status: She is alert.     Comments: CN 3-12 grossly intact A&O x4 GCS 15 Sensation and strength intact Gait nonataxic including with tandem walking Coordination with finger-to-nose WNL Neg romberg, neg pronator drift Neg Brudzinki's or Kernig's sign       ED Treatments / Results  Labs (all labs ordered are listed, but only abnormal results are displayed) Labs Reviewed  COMPREHENSIVE METABOLIC PANEL - Abnormal; Notable for the following components:      Result Value   Glucose, Bld 106 (*)    All other components within normal limits  SARS CORONAVIRUS 2 (HOSPITAL ORDER, PERFORMED IN [facility])  CBC WITH DIFFERENTIAL/PLATELET  LACTIC ACID, PLASMA  LACTIC ACID, PLASMA  URINALYSIS, ROUTINE W REFLEX MICROSCOPIC  PREGNANCY, URINE    EKG None  Radiology Dg Chest Port 1 View  Result Date: 08/04/2018 CLINICAL DATA:  Sudden onset of fever. EXAM: PORTABLE CHEST 1 VIEW COMPARISON:  None. FINDINGS: The heart size and mediastinal contours are within normal limits. Both lungs are clear. The visualized skeletal structures are unremarkable. IMPRESSION: No active disease. Electronically Signed   By: Gerome Sam III M.D   On: 08/04/2018 14:38    Procedures Procedures (including critical care time)  Medications Ordered in ED Medications  sodium chloride 0.9 % bolus 1,000 mL (1,000 mLs Intravenous New Bag/Given 08/04/18 1416)  acetaminophen (TYLENOL) tablet 650 mg (650 mg Oral Given 08/04/18 1414)     Initial Impression / Assessment and Plan / ED Course  I have reviewed the triage vital signs and the nursing notes.  Pertinent labs  & imaging results that were available during my care of the patient were reviewed by me and considered in my medical decision making (see chart for details).    Pt is a 33 year old otherwise healthy female who presents with fever, chills, body aches, and headache. No other symptoms at this time. No red flag symptoms including vision changes, rash, neck stiffness. No focal neuro deficits on exam. No known exposure to covid positive patient although works in a daycare. Pt febrile in the ED at 101.9; tylenol 650 mg given. Will get baseline bloodwork today including CBC, CMP, lactic acid, U/A, and test for  covid 19. Pt has no increased work of breathing, she is satting 100% on RA. 1 L NS bolus given as well. Will reevaluate once labs and cxr return.   CXR negative at this time. No leukocytosis to suggest infection. Electrolytes within normal limits as well. Lactic acid 1.0; do not feel pt needs repeat. Awaiting U/A, covid test, and urine preg.   3:28 PM At shift change case signed out to Lyndel Safe, [place], who will dispo patient accordingly. Anticipate discharge home as patient without increased work of breathing and appears stable at this time.   Elif Ivers was evaluated in Emergency Department on 08/04/2018 for the symptoms described in the history of present illness. She was evaluated in the context of the global COVID-19 pandemic, which necessitated consideration that the patient might be at risk for infection with the SARS-CoV-2 virus that causes COVID-19. Institutional protocols and algorithms that pertain to the evaluation of patients at risk for COVID-19 are in a state of rapid change based on information released by regulatory bodies including the CDC and federal and state organizations. These policies and algorithms were followed during the patient's care in the ED.        Final Clinical Impressions(s) / ED Diagnoses   Final diagnoses:  None    ED Discharge Orders    None        Tanda Rockers, PA-C 08/04/18 1529    Gwyneth Sprout, MD 08/05/18 2207

## 2019-02-26 ENCOUNTER — Ambulatory Visit: Payer: Medicaid - Out of State | Attending: Internal Medicine

## 2019-02-26 DIAGNOSIS — Z20822 Contact with and (suspected) exposure to covid-19: Secondary | ICD-10-CM

## 2019-02-28 LAB — NOVEL CORONAVIRUS, NAA: SARS-CoV-2, NAA: NOT DETECTED
